# Patient Record
Sex: Female | Born: 1995 | Race: Black or African American | Hispanic: No | Marital: Single | Smoking: Never smoker
Health system: Southern US, Community
[De-identification: ages and names within clinical notes are randomized; demographics above are authoritative.]

---

## 2016-10-15 ENCOUNTER — Encounter (HOSPITAL_COMMUNITY): Payer: Self-pay | Admitting: Emergency Medicine

## 2016-10-15 ENCOUNTER — Emergency Department (HOSPITAL_COMMUNITY)
Admission: EM | Admit: 2016-10-15 | Discharge: 2016-10-16 | Disposition: A | Discharge status: Home / Self Care | Payer: BLUE CROSS/BLUE SHIELD | Attending: Emergency Medicine | Admitting: Emergency Medicine

## 2016-10-15 DIAGNOSIS — F17210 Nicotine dependence, cigarettes, uncomplicated: Secondary | ICD-10-CM | POA: Diagnosis not present

## 2016-10-15 DIAGNOSIS — J069 Acute upper respiratory infection, unspecified: Secondary | ICD-10-CM | POA: Insufficient documentation

## 2016-10-15 DIAGNOSIS — R0602 Shortness of breath: Secondary | ICD-10-CM | POA: Diagnosis present

## 2016-10-15 NOTE — ED Triage Notes (Signed)
Pt states she has felt short of breath off and on for the past week  Pt has no hx of asthma  Pt is in no acute distress

## 2016-10-16 ENCOUNTER — Emergency Department (HOSPITAL_COMMUNITY): Payer: BLUE CROSS/BLUE SHIELD

## 2016-10-16 MED ORDER — ALBUTEROL SULFATE HFA 108 (90 BASE) MCG/ACT IN AERS
2.0000 | INHALATION_SPRAY | RESPIRATORY_TRACT | Status: DC | PRN
  Filled 2016-10-16: qty 6.7

## 2016-10-16 NOTE — Discharge Instructions (Signed)
See your Physician for recheck.  Use inhaler 2 puffs every 4 hours.  °

## 2016-10-16 NOTE — ED Provider Notes (Signed)
  [facility]EMERGENCY DEPT Provider Note   CSN: [id number] Arrival date & time: 10/15/16  2312     History   Chief Complaint Chief Complaint  Patient presents with  . Shortness of Breath    HPI Karen Burch is a 21 y.o. female.  The history is provided by the patient.  Shortness of Breath  This is a new problem. The current episode started yesterday. Associated symptoms include sputum production. The treatment provided no relief. Associated medical issues do not include asthma.    History reviewed. No pertinent past medical history.  There are no active problems to display for this patient.   History reviewed. No pertinent surgical history.  OB History    No data available       Home Medications    Prior to Admission medications   Not on File    Family History Family History  Problem Relation Age of Onset  . Asthma Father   . Hypertension Other     Social History Social History  Substance Use Topics  . Smoking status: Current Every Day Smoker    Types: Cigarettes  . Smokeless tobacco: Never Used  . Alcohol use No     Allergies   Patient has no known allergies.   Review of Systems Review of Systems  Respiratory: Positive for sputum production and shortness of breath.   All other systems reviewed and are negative.    Physical Exam Updated Vital Signs BP 128/86 (BP Location: Left Arm)   Pulse 93   Temp 98.2 F (36.8 C) (Oral)   Resp 16   Ht  (1.6 m)   Wt 59 kg   LMP 10/08/2016 (Approximate)   SpO2 100%   BMI 23.03 kg/m   Physical Exam  Constitutional: She appears well-developed and well-nourished. No distress.  HENT:  Head: Normocephalic and atraumatic.  Eyes: Conjunctivae are normal.  Neck: Neck supple.  Cardiovascular: Normal rate and regular rhythm.   No murmur heard. Pulmonary/Chest: Effort normal and breath sounds normal. No respiratory distress.  Abdominal: Soft. There is no tenderness.  Musculoskeletal: She exhibits no  edema.  Neurological: She is alert.  Skin: Skin is warm and dry.  Psychiatric: She has a normal mood and affect.  Nursing note and vitals reviewed.    ED Treatments / Results  Labs (all labs ordered are listed, but only abnormal results are displayed) Labs Reviewed - No data to display  EKG  EKG Interpretation None       Radiology No results found.  Procedures Procedures (including critical care time)  Medications Ordered in ED Medications - No data to display   Initial Impression / Assessment and Plan / ED Course  I have reviewed the triage vital signs and the nursing notes.  Pertinent labs & imaging results that were available during my care of the patient were reviewed by me and considered in my medical decision making (see chart for details).       Final Clinical Impressions(s) / ED Diagnoses   Final diagnoses:  Viral upper respiratory tract infection    New Prescriptions New Prescriptions   No medications on file  Pt given albuterol inhaler.   Follow up with primary care for recheck in 3-4 days. An After Visit Summary was printed and given to the patient.   Lonia Skinner [place], PA-C 10/16/16 4098    Pricilla Loveless, MD 10/16/16 [phone]

## 2019-01-31 DIAGNOSIS — H5213 Myopia, bilateral: Secondary | ICD-10-CM | POA: Diagnosis not present

## 2019-01-31 DIAGNOSIS — H52223 Regular astigmatism, bilateral: Secondary | ICD-10-CM | POA: Diagnosis not present

## 2020-07-14 ENCOUNTER — Other Ambulatory Visit: Payer: Self-pay

## 2020-07-14 ENCOUNTER — Ambulatory Visit
Admission: EM | Admit: 2020-07-14 | Discharge: 2020-07-14 | Disposition: A | Discharge status: Home / Self Care | Payer: Federal, State, Local not specified - PPO | Attending: Internal Medicine | Admitting: Internal Medicine

## 2020-07-14 ENCOUNTER — Encounter: Payer: Self-pay | Admitting: Emergency Medicine

## 2020-07-14 DIAGNOSIS — R112 Nausea with vomiting, unspecified: Secondary | ICD-10-CM | POA: Diagnosis not present

## 2020-07-14 DIAGNOSIS — N3001 Acute cystitis with hematuria: Secondary | ICD-10-CM | POA: Insufficient documentation

## 2020-07-14 LAB — POCT URINALYSIS DIP (MANUAL ENTRY)
Bilirubin, UA: NEGATIVE
Glucose, UA: NEGATIVE mg/dL
Ketones, POC UA: NEGATIVE mg/dL
Nitrite, UA: NEGATIVE
Protein Ur, POC: NEGATIVE mg/dL
Spec Grav, UA: 1.015 (ref 1.010–1.025)
Urobilinogen, UA: 0.2 E.U./dL
pH, UA: 7 (ref 5.0–8.0)

## 2020-07-14 LAB — POCT URINE PREGNANCY: Preg Test, Ur: NEGATIVE

## 2020-07-14 MED ORDER — NITROFURANTOIN MONOHYD MACRO 100 MG PO CAPS: 100.0000 mg | ORAL_CAPSULE | Freq: Two times a day (BID) | ORAL | 0 refills | Status: AC

## 2020-07-14 NOTE — ED Triage Notes (Signed)
Pt sts nausea x 4 days with generalized weakness; pt sts some chills and had fever 2 days ago; pt sts "thinks she could be pregnant even though home test are negative" pt sts LMP was 12/24

## 2020-07-14 NOTE — ED Provider Notes (Signed)
[facility]    CSN: [id number] Arrival date & time: 07/14/20  1507      History   Chief Complaint Chief Complaint  Patient presents with  . Nausea  . Weakness    HPI Karen Burch is a 25 y.o. female with no past medical history comes to urgent care with complaints of early morning vomiting, fatigue over the past 4 days.   Patient's last menstrual period was June 26, 2020.  She denies any fever or chills.  No body aches.  No dizziness, no flank pain.  Patient has extreme fatigue over the course of the same.  No dizziness. HPI  History reviewed. No pertinent past medical history.  There are no problems to display for this patient.   History reviewed. No pertinent surgical history.  OB History   No obstetric history on file.      Home Medications    Prior to Admission medications   Not on File    Family History Family History  Problem Relation Age of Onset  . Asthma Father   . Hypertension Other     Social History Social History   Tobacco Use  . Smoking status: Current Every Day Smoker    Types: Cigarettes  . Smokeless tobacco: Never Used  Substance Use Topics  . Alcohol use: No  . Drug use: Yes    Types: Marijuana     Allergies   Patient has no known allergies.   Review of Systems Review of Systems  Constitutional: Negative.   Endocrine: Negative.   Genitourinary: Negative.   Musculoskeletal: Negative.   Neurological: Negative.      Physical Exam Triage Vital Signs ED Triage Vitals [07/14/20 1652]  Enc Vitals Group     BP 128/84     Pulse Rate 86     Resp 18     Temp 97.9 F (36.6 C)     Temp Source Oral     SpO2 99 %     Weight      Height      Head Circumference      Peak Flow      Pain Score 4     Pain Loc      Pain Edu?      Excl. in GC?    No data found.  Updated Vital Signs BP 128/84   Pulse 86   Temp 97.9 F (36.6 C) (Oral)   Resp 18   LMP 06/26/2020   SpO2 99%   Visual Acuity Right Eye  Distance:   Left Eye Distance:   Bilateral Distance:    Right Eye Near:   Left Eye Near:    Bilateral Near:     Physical Exam Vitals and nursing note reviewed.  Constitutional:      General: She is not in acute distress.    Appearance: She is not ill-appearing.  Cardiovascular:     Rate and Rhythm: Normal rate and regular rhythm.     Pulses: Normal pulses.     Heart sounds: Normal heart sounds.  Pulmonary:     Effort: Pulmonary effort is normal.     Breath sounds: Normal breath sounds.  Abdominal:     General: Bowel sounds are normal.     Palpations: Abdomen is soft.  Neurological:     Mental Status: She is alert.      UC Treatments / Results  Labs (all labs ordered are listed, but only abnormal results are displayed) Labs Reviewed  POCT  URINALYSIS DIP (MANUAL ENTRY) - Abnormal; Notable for the following components:      Result Value   Clarity, UA cloudy (*)    Blood, UA trace-intact (*)    Leukocytes, UA Trace (*)    All other components within normal limits  URINE CULTURE  CBC WITH DIFFERENTIAL/PLATELET  TSH  VITAMIN D 25 HYDROXY (VIT D DEFICIENCY, FRACTURES)  POCT URINE PREGNANCY    EKG   Radiology No results found.  Procedures Procedures (including critical care time)  Medications Ordered in UC Medications - No data to display  Initial Impression / Assessment and Plan / UC Course  I have reviewed the triage vital signs and the nursing notes.  Pertinent labs & imaging results that were available during my care of the patient were reviewed by me and considered in my medical decision making (see chart for details).     1.  Fatigue with non-intractable nausea or vomiting: Point-of-care urinalysis is significant for leukocyte Estrace and nitrites Urine cultures have been sent Patient denies any dysuria urgency or frequency We will wait for the urine cultures before starting antibiotics if cultures are positive CBC, BMP, vitamin D, TSH. Will call  patient with labs if abnormal. Final Clinical Impressions(s) / UC Diagnoses   Final diagnoses:  Acute cystitis with hematuria  Non-intractable vomiting with nausea, unspecified vomiting type     Discharge Instructions     Take medications as prescribed If symptoms worsen please return to urgent care to be reevaluated Increase oral fluid intake We will call you with recommendations of your lab results abnormal.   ED Prescriptions    None     PDMP not reviewed this encounter.   Merrilee Jansky, MD 07/14/20 [phone]

## 2020-07-14 NOTE — Discharge Instructions (Addendum)
Take medications as prescribed Hold off filling the antibiotics.  If your urine cultures are significant, we will tell you when to pick up the antibiotics. If symptoms worsen please return to urgent care to be reevaluated Increase oral fluid intake We will call you with recommendations of your lab results abnormal.

## 2020-07-15 LAB — CBC WITH DIFFERENTIAL/PLATELET
Basophils Absolute: 0 10*3/uL (ref 0.0–0.2)
Basos: 1 %
EOS (ABSOLUTE): 0 10*3/uL (ref 0.0–0.4)
Eos: 0 %
Hematocrit: 42.5 % (ref 34.0–46.6)
Hemoglobin: 13.9 g/dL (ref 11.1–15.9)
Immature Grans (Abs): 0 10*3/uL (ref 0.0–0.1)
Immature Granulocytes: 0 %
Lymphocytes Absolute: 1.5 10*3/uL (ref 0.7–3.1)
Lymphs: 25 %
MCH: 26 pg — ABNORMAL LOW (ref 26.6–33.0)
MCHC: 32.7 g/dL (ref 31.5–35.7)
MCV: 79 fL (ref 79–97)
Monocytes Absolute: 0.5 10*3/uL (ref 0.1–0.9)
Monocytes: 7 %
Neutrophils Absolute: 4.1 10*3/uL (ref 1.4–7.0)
Neutrophils: 67 %
Platelets: 477 10*3/uL — ABNORMAL HIGH (ref 150–450)
RBC: 5.35 x10E6/uL — ABNORMAL HIGH (ref 3.77–5.28)
RDW: 13.9 % (ref 11.7–15.4)
WBC: 6.1 10*3/uL (ref 3.4–10.8)

## 2020-07-15 LAB — TSH: TSH: 1.31 u[IU]/mL (ref 0.450–4.500)

## 2020-07-15 LAB — VITAMIN D 25 HYDROXY (VIT D DEFICIENCY, FRACTURES): Vit D, 25-Hydroxy: 8.2 ng/mL — ABNORMAL LOW (ref 30.0–100.0)

## 2020-07-16 LAB — URINE CULTURE
Culture: <10000 — AB
Special Requests: NORMAL

## 2020-08-03 ENCOUNTER — Other Ambulatory Visit: Payer: Self-pay

## 2020-08-03 ENCOUNTER — Emergency Department (HOSPITAL_COMMUNITY)
Admission: EM | Admit: 2020-08-03 | Discharge: 2020-08-03 | Disposition: A | Discharge status: Home / Self Care | Payer: Federal, State, Local not specified - PPO | Attending: Emergency Medicine | Admitting: Emergency Medicine

## 2020-08-03 ENCOUNTER — Encounter: Payer: Self-pay | Admitting: Physician Assistant

## 2020-08-03 DIAGNOSIS — Z20822 Contact with and (suspected) exposure to covid-19: Secondary | ICD-10-CM | POA: Insufficient documentation

## 2020-08-03 DIAGNOSIS — F17210 Nicotine dependence, cigarettes, uncomplicated: Secondary | ICD-10-CM | POA: Diagnosis not present

## 2020-08-03 DIAGNOSIS — R112 Nausea with vomiting, unspecified: Secondary | ICD-10-CM | POA: Diagnosis not present

## 2020-08-03 LAB — URINALYSIS, ROUTINE W REFLEX MICROSCOPIC
Bilirubin Urine: NEGATIVE
Glucose, UA: NEGATIVE mg/dL
Hgb urine dipstick: NEGATIVE
Ketones, ur: NEGATIVE mg/dL
Leukocytes,Ua: NEGATIVE
Nitrite: NEGATIVE
Protein, ur: NEGATIVE mg/dL
Specific Gravity, Urine: 1.024 (ref 1.005–1.030)
pH: 5 (ref 5.0–8.0)

## 2020-08-03 LAB — COMPREHENSIVE METABOLIC PANEL
ALT: 16 U/L (ref 0–44)
AST: 19 U/L (ref 15–41)
Albumin: 4 g/dL (ref 3.5–5.0)
Alkaline Phosphatase: 85 U/L (ref 38–126)
Anion gap: 11 (ref 5–15)
BUN: 12 mg/dL (ref 6–20)
CO2: 25 mmol/L (ref 22–32)
Calcium: 9.2 mg/dL (ref 8.9–10.3)
Chloride: 102 mmol/L (ref 98–111)
Creatinine, Ser: 0.84 mg/dL (ref 0.44–1.00)
GFR, Estimated: >60 mL/min (ref >60)
Glucose, Bld: 93 mg/dL (ref 70–99)
Potassium: 3.9 mmol/L (ref 3.5–5.1)
Sodium: 138 mmol/L (ref 135–145)
Total Bilirubin: 0.6 mg/dL (ref 0.3–1.2)
Total Protein: 8.3 g/dL — ABNORMAL HIGH (ref 6.5–8.1)

## 2020-08-03 LAB — RAPID URINE DRUG SCREEN, HOSP PERFORMED
Amphetamines: NOT DETECTED
Barbiturates: NOT DETECTED
Benzodiazepines: NOT DETECTED
Cocaine: NOT DETECTED
Opiates: NOT DETECTED
Tetrahydrocannabinol: NOT DETECTED

## 2020-08-03 LAB — CBC
HCT: 43 % (ref 36.0–46.0)
Hemoglobin: 14.1 g/dL (ref 12.0–15.0)
MCH: 27.4 pg (ref 26.0–34.0)
MCHC: 32.8 g/dL (ref 30.0–36.0)
MCV: 83.5 fL (ref 80.0–100.0)
Platelets: 462 10*3/uL — ABNORMAL HIGH (ref 150–400)
RBC: 5.15 MIL/uL — ABNORMAL HIGH (ref 3.87–5.11)
RDW: 15.2 % (ref 11.5–15.5)
WBC: 5 10*3/uL (ref 4.0–10.5)
nRBC: 0 % (ref 0.0–0.2)

## 2020-08-03 LAB — I-STAT BETA HCG BLOOD, ED (MC, WL, AP ONLY): I-stat hCG, quantitative: <5 m[IU]/mL (ref <5)

## 2020-08-03 LAB — LIPASE, BLOOD: Lipase: 32 U/L (ref 11–51)

## 2020-08-03 LAB — POC SARS CORONAVIRUS 2 AG -  ED: SARS Coronavirus 2 Ag: NEGATIVE

## 2020-08-03 MED ORDER — ONDANSETRON HCL 4 MG/2ML IJ SOLN
4.0000 mg | Freq: Once | INTRAMUSCULAR | Status: AC
  Administered 2020-08-03: 4 mg via INTRAVENOUS
  Filled 2020-08-03: qty 2

## 2020-08-03 MED ORDER — SODIUM CHLORIDE 0.9 % IV BOLUS
1000.0000 mL | Freq: Once | INTRAVENOUS | Status: AC
  Administered 2020-08-03: 1000 mL via INTRAVENOUS

## 2020-08-03 MED ORDER — ONDANSETRON 4 MG PO TBDP: 4.0000 mg | ORAL_TABLET | Freq: Three times a day (TID) | ORAL | 0 refills | Status: DC | PRN

## 2020-08-03 NOTE — Discharge Instructions (Addendum)
Please follow up with [facility] regarding your persistent symptoms Pick up medication and take as prescribed Increase your water intake at home to stay hydrated Return to the ED for any worsening symptoms

## 2020-08-03 NOTE — ED Triage Notes (Signed)
Pt reports n/v x 1 month, endorses vomiting every morning. Multiple negative pregnancy tests at home. Was dx with UTI at [facility] on 1/11 and has completed course of abx.

## 2020-08-03 NOTE — ED Provider Notes (Signed)
[facility] EMERGENCY DEPARTMENT Provider Note   CSN: [id number] Arrival date & time: 08/03/20  5329     History Chief Complaint  Patient presents with  . Nausea  . Emesis    Karen Burch is a 25 y.o. female who presents to the ED today with complaint of persistent nausea and NBNB emesis x 6 weeks. Pt reports that every morning she will wake up and feel immediately nauseated and then go to the bathroom and vomit. She reports that throughout the day at times she will feel lightheaded as well and like she needs to sit down. She has taken multiple pregnancy tests at home which have been negative. LNMP 1/20. Pt went to UC on 1/11 and had a U/A done at that time with trace leuks and was diagnosed with a UTI despite no urinary symptoms. Urine pregnancy, TSH, and CBC unremarkable at that time. Pt did have a VitD level drawn which was low as well - states she was not told about this. She was prescribed nitrofurantoin and states she had no improvement in her symptoms. Pt denies any abdominal pain associated. She does report she is now having mild looser stools however denies severe watery diarrhea. Denies fevers, chills, room spinning dizziness, headache, unilateral weakness or numbness, confusion, urinary symptoms, pelvic pain, vaginal discharge, or any other associated symptoms. No previous abdominal surgeries. Pt reports she has smoked marijuana in the past however denies any recently.   The history is provided by the patient and medical records.       No past medical history on file.  There are no problems to display for this patient.   No past surgical history on file.   OB History   No obstetric history on file.     Family History  Problem Relation Age of Onset  . Asthma Father   . Hypertension Other     Social History   Tobacco Use  . Smoking status: Current Every Day Smoker    Types: Cigarettes  . Smokeless tobacco: Never Used  Substance Use Topics  .  Alcohol use: No  . Drug use: Yes    Types: Marijuana    Home Medications Prior to Admission medications   Medication Sig Start Date End Date Taking? Authorizing Provider  ondansetron (ZOFRAN ODT) 4 MG disintegrating tablet Take 1 tablet (4 mg total) by mouth every 8 (eight) hours as needed for nausea or vomiting. 08/03/20  Yes Tanda Rockers, PA-C    Allergies    Patient has no known allergies.  Review of Systems   Review of Systems  Constitutional: Negative for chills and fever.  Eyes: Negative for visual disturbance.  Respiratory: Negative for shortness of breath.   Cardiovascular: Negative for chest pain.  Gastrointestinal: Positive for diarrhea, nausea and vomiting. Negative for abdominal pain and constipation.  Genitourinary: Negative for dysuria, frequency, menstrual problem, vaginal discharge and vaginal pain.  Neurological: Positive for light-headedness. Negative for dizziness, syncope, weakness, numbness and headaches.  All other systems reviewed and are negative.   Physical Exam Updated Vital Signs BP 128/87   Pulse 82   Temp 99.7 F (37.6 C) (Oral)   Resp 16   Ht 5\' 3"  (1.6 m)   LMP 07/23/2020 (Exact Date)   SpO2 100%   BMI 23.03 kg/m   Physical Exam Vitals and nursing note reviewed.  Constitutional:      Appearance: She is not ill-appearing or diaphoretic.  HENT:     Head: Normocephalic and atraumatic.  Mouth/Throat:     Mouth: Mucous membranes are dry.  Eyes:     Extraocular Movements: Extraocular movements intact.     Conjunctiva/sclera: Conjunctivae normal.     Pupils: Pupils are equal, round, and reactive to light.  Cardiovascular:     Rate and Rhythm: Normal rate and regular rhythm.     Pulses: Normal pulses.  Pulmonary:     Effort: Pulmonary effort is normal.     Breath sounds: Normal breath sounds. No wheezing, rhonchi or rales.  Abdominal:     Palpations: Abdomen is soft.     Tenderness: There is no abdominal tenderness. There is no  right CVA tenderness, left CVA tenderness, guarding or rebound.  Musculoskeletal:     Cervical back: Neck supple.  Skin:    General: Skin is warm and dry.  Neurological:     Mental Status: She is alert.     ED Results / Procedures / Treatments   Labs (all labs ordered are listed, but only abnormal results are displayed) Labs Reviewed  COMPREHENSIVE METABOLIC PANEL - Abnormal; Notable for the following components:      Result Value   Total Protein 8.3 (*)    All other components within normal limits  CBC - Abnormal; Notable for the following components:   RBC 5.15 (*)    Platelets 462 (*)    All other components within normal limits  URINALYSIS, ROUTINE W REFLEX MICROSCOPIC - Abnormal; Notable for the following components:   APPearance HAZY (*)    All other components within normal limits  LIPASE, BLOOD  RAPID URINE DRUG SCREEN, HOSP PERFORMED  I-STAT BETA HCG BLOOD, ED ([facility], [facility], [facility] ONLY)  POC SARS CORONAVIRUS 2 AG -  ED    EKG None  Radiology No results found.  Procedures Procedures   Medications Ordered in ED Medications  sodium chloride 0.9 % bolus 1,000 mL (1,000 mLs Intravenous New Bag/Given 08/03/20 1011)  ondansetron (ZOFRAN) injection 4 mg (4 mg Intravenous Given 08/03/20 1007)    ED Course  I have reviewed the triage vital signs and the nursing notes.  Pertinent labs & imaging results that were available during my care of the patient were reviewed by me and considered in my medical decision making (see chart for details).    MDM Rules/Calculators/A&P                          25 year old female presents to the ED today complaining of persistent nausea and vomiting most pacifically in the mornings for the past 6 weeks.  Was seen at urgent care and diagnosed with a UTI 2 weeks ago despite not having any urinary symptoms, no improvement with antibiotics.  On arrival to the ED temperature slightly elevated 99.7.  She is nontachycardic, nontachypneic.  She  appears to be in no acute distress.  Blood pressure normotensive at 128/87.  On exam she has no abdominal tenderness palpation.  No CVA tenderness.  She does have some dry mucous membranes today.  Normal menstrual period 1/20.  She had lab work done while in the waiting room which was unremarkable including a CBC, CMP, lipase, UA, beta hCG as well as a point-of-care Covid test.  Given dry mucous membranes will provide fluids and Zofran at this time.  We will also add on a UDS today, question CHS causing symptoms.  Given no abdominal tenderness palpation and no complaint of abdominal pain I do not feel a CT scan is  warranted at this time.  Patient may benefit from GI referral in the outpatient setting.    Orthostatics positive from 128/83 > 106/73. Fluids provided UDS negative  On reevaluation pt resting comfortably. She has been able to eat crackers and drink without emesis. Will discharge home at this time with zofran and GI referral. Pt is in agreement with plan. Stable for discharge.   This note was prepared using Dragon voice recognition software and may include unintentional dictation errors due to the inherent limitations of voice recognition software.  Final Clinical Impression(s) / ED Diagnoses Final diagnoses:  Non-intractable vomiting with nausea, unspecified vomiting type    Rx / DC Orders ED Discharge Orders         Ordered    ondansetron (ZOFRAN ODT) 4 MG disintegrating tablet  Every 8 hours PRN        08/03/20 1214           Discharge Instructions     Please follow up with [facility] regarding your persistent symptoms Pick up medication and take as prescribed Increase your water intake at home to stay hydrated Return to the ED for any worsening symptoms       Tanda Rockers, PA-C 08/03/20 1215    Virgina Norfolk, DO 08/03/20 1224

## 2020-08-09 ENCOUNTER — Encounter (HOSPITAL_COMMUNITY): Payer: Self-pay | Admitting: Emergency Medicine

## 2020-08-09 ENCOUNTER — Emergency Department (HOSPITAL_COMMUNITY)
Admission: EM | Admit: 2020-08-09 | Discharge: 2020-08-09 | Disposition: A | Discharge status: Home / Self Care | Payer: Federal, State, Local not specified - PPO | Attending: Emergency Medicine | Admitting: Emergency Medicine

## 2020-08-09 ENCOUNTER — Other Ambulatory Visit: Payer: Self-pay

## 2020-08-09 DIAGNOSIS — Z5321 Procedure and treatment not carried out due to patient leaving prior to being seen by health care provider: Secondary | ICD-10-CM | POA: Diagnosis not present

## 2020-08-09 DIAGNOSIS — R1031 Right lower quadrant pain: Secondary | ICD-10-CM | POA: Insufficient documentation

## 2020-08-09 DIAGNOSIS — R1110 Vomiting, unspecified: Secondary | ICD-10-CM | POA: Insufficient documentation

## 2020-08-09 LAB — URINALYSIS, ROUTINE W REFLEX MICROSCOPIC
Bilirubin Urine: NEGATIVE
Glucose, UA: NEGATIVE mg/dL
Hgb urine dipstick: NEGATIVE
Ketones, ur: NEGATIVE mg/dL
Leukocytes,Ua: NEGATIVE
Nitrite: NEGATIVE
Protein, ur: NEGATIVE mg/dL
Specific Gravity, Urine: 1.028 (ref 1.005–1.030)
pH: 6 (ref 5.0–8.0)

## 2020-08-09 LAB — COMPREHENSIVE METABOLIC PANEL
ALT: 15 U/L (ref 0–44)
AST: 18 U/L (ref 15–41)
Albumin: 3.8 g/dL (ref 3.5–5.0)
Alkaline Phosphatase: 74 U/L (ref 38–126)
Anion gap: 9 (ref 5–15)
BUN: 12 mg/dL (ref 6–20)
CO2: 25 mmol/L (ref 22–32)
Calcium: 9.3 mg/dL (ref 8.9–10.3)
Chloride: 105 mmol/L (ref 98–111)
Creatinine, Ser: 0.87 mg/dL (ref 0.44–1.00)
GFR, Estimated: >60 mL/min (ref >60)
Glucose, Bld: 93 mg/dL (ref 70–99)
Potassium: 4.4 mmol/L (ref 3.5–5.1)
Sodium: 139 mmol/L (ref 135–145)
Total Bilirubin: 0.4 mg/dL (ref 0.3–1.2)
Total Protein: 7.9 g/dL (ref 6.5–8.1)

## 2020-08-09 LAB — CBC
HCT: 43 % (ref 36.0–46.0)
Hemoglobin: 13.2 g/dL (ref 12.0–15.0)
MCH: 25.6 pg — ABNORMAL LOW (ref 26.0–34.0)
MCHC: 30.7 g/dL (ref 30.0–36.0)
MCV: 83.3 fL (ref 80.0–100.0)
Platelets: 412 10*3/uL — ABNORMAL HIGH (ref 150–400)
RBC: 5.16 MIL/uL — ABNORMAL HIGH (ref 3.87–5.11)
RDW: 15 % (ref 11.5–15.5)
WBC: 4.2 10*3/uL (ref 4.0–10.5)
nRBC: 0 % (ref 0.0–0.2)

## 2020-08-09 LAB — LIPASE, BLOOD: Lipase: 29 U/L (ref 11–51)

## 2020-08-09 LAB — I-STAT BETA HCG BLOOD, ED (MC, WL, AP ONLY): I-stat hCG, quantitative: <5 m[IU]/mL (ref <5)

## 2020-08-09 NOTE — ED Notes (Signed)
Patient states she has been here 9 hours and is unable to stay any longer

## 2020-08-09 NOTE — ED Triage Notes (Signed)
C/o RLQ pressure since Friday. Denies urinary complaints.  States she was seen in ED on 1/31 due to vomiting every morning x 1 month.  States that continues and she is scheduled to see gastroenterologist on 2/14 but pain is new since Friday.

## 2020-08-17 ENCOUNTER — Encounter: Payer: Self-pay | Admitting: Physician Assistant

## 2020-08-17 ENCOUNTER — Ambulatory Visit (INDEPENDENT_AMBULATORY_CARE_PROVIDER_SITE_OTHER): Payer: Federal, State, Local not specified - PPO | Admitting: Physician Assistant

## 2020-08-17 VITALS — BP 118/74 | HR 80 | Ht 63.0 in | Wt 182.0 lb

## 2020-08-17 DIAGNOSIS — R112 Nausea with vomiting, unspecified: Secondary | ICD-10-CM | POA: Diagnosis not present

## 2020-08-17 DIAGNOSIS — R1030 Lower abdominal pain, unspecified: Secondary | ICD-10-CM | POA: Diagnosis not present

## 2020-08-17 MED ORDER — ONDANSETRON 4 MG PO TBDP: 4.0000 mg | ORAL_TABLET | Freq: Four times a day (QID) | ORAL | 1 refills | Status: DC | PRN

## 2020-08-17 NOTE — Patient Instructions (Addendum)
If you are age 24 or older, your body mass index should be between 23-30. Your Body mass index is 32.24 kg/m. If this is out of the aforementioned range listed, please consider follow up with your Primary Care Provider.  If you are age 26 or younger, your body mass index should be between 19-25. Your Body mass index is 32.24 kg/m. If this is out of the aformentioned range listed, please consider follow up with your Primary Care Provider.   You have been scheduled for a CT scan of the abdomen and pelvis at [facility], 1st floor Radiology. You are scheduled on 08/28/2020  at 8:30. You should arrive 15 minutes prior to your appointment time for registration.  Please pick up 2 bottles of contrast from [facility] at least 3 days prior to your scan. The solution may taste better if refrigerated, but do NOT add ice or any other liquid to this solution. Shake well before drinking.   Please follow the written instructions below on the day of your exam:   1) Do not eat anything after 4:30 am (4 hours prior to your test)   2) Drink 1 bottle of contrast @ 6:30 am (2 hours prior to your exam)  Remember to shake well before drinking and do NOT pour over ice.     Drink 1 bottle of contrast @ 7:30 am (1 hour prior to your exam)   You may take any medications as prescribed with a small amount of water, if necessary. If you take any of the following medications: METFORMIN, GLUCOPHAGE, GLUCOVANCE, AVANDAMET, RIOMET, FORTAMET, [place] MET, JANUMET, GLUMETZA or METAGLIP, you MAY be asked to HOLD this medication 48 hours AFTER the exam.   The purpose of you drinking the oral contrast is to aid in the visualization of your intestinal tract. The contrast solution may cause some diarrhea. Depending on your individual set of symptoms, you may also receive an intravenous injection of x-ray contrast/dye. Plan on being at [facility] for 45 minutes or longer, depending on the type of exam you are having performed.    If you have any questions regarding your exam or if you need to reschedule, you may call [facility] at [phone] between the hours of 8:00 am and 5:00 pm, Monday-Friday.   We have sent in refill of your Ondansetron  Follow up pending the results of your CT or as needed.  Thank you for entrusting me with your care and choosing [facility].  Amy Esterwood, PA-C

## 2020-08-17 NOTE — Progress Notes (Signed)
Subjective:    Patient ID: Karen Burch, female    DOB: [birth year], 25 y.o.   MRN: [id number]  HPI Karen Burch is a 25 year old African-American female, new to GI today referred after recent emergency room visit at [facility].  She has not had any prior GI evaluation.  She was seen in the ER on 07/14/2020 with nausea and vomiting and felt to have cystitis and was treated with a course of nitrofurantoin.  She returned 2 weeks later with persistent complaints of nausea and vomiting.  She has not had any abdominal imaging.  She has had baseline labs with CBC and c-Met unremarkable, beta hCG on 2 occasions negative and COVID-19 negative on 08/03/2020.  She has been given a prescription for Zofran and says that is definitely helping. She says she had onset of symptoms in the beginning of January with ongoing queasiness and intermittent episodes of vomiting.  She was not having any abdominal pain at onset of symptoms.  Prior to that she had been experiencing some intermittent low-grade fevers and lightheadedness which then resolved.  She is not had any changes in bowel habits.  Over the past 4 days symptoms have resolved and she has not had any nausea vomiting or queasiness.  She says she is eating fine at this point and feels better she has not had any heartburn or indigestion.  No new medications supplements and no significant stress etc.  No known infectious exposures. She also had ER visit because she was feeling a very heavy pressure bilaterally in her lower quadrants.  She says she waited for about 8 hours in the ER and was not seen and finally left. She has been having some sensation of this mild pressure heaviness bilaterally in the lower abdomen continuing. Family history negative for GI disease as far she is aware. She has no complaints of any urinary symptoms today.  Bowel movements fairly regular no melena or hematochezia.  Review of Systems Pertinent positive and negative review of systems were noted  in the above HPI section.  All other review of systems was otherwise negative.  Outpatient Encounter Medications as of 08/17/2020  Medication Sig  . acetaminophen (TYLENOL) 325 MG tablet Take 650 mg by mouth every 6 (six) hours as needed.  . ondansetron (ZOFRAN ODT) 4 MG disintegrating tablet Take 1 tablet (4 mg total) by mouth every 6 (six) hours as needed for nausea or vomiting.  . [DISCONTINUED] ondansetron (ZOFRAN ODT) 4 MG disintegrating tablet Take 1 tablet (4 mg total) by mouth every 8 (eight) hours as needed for nausea or vomiting. (Patient not taking: Reported on 08/17/2020)   No facility-administered encounter medications on file as of 08/17/2020.   No Known Allergies There are no problems to display for this patient.  Social History   Socioeconomic History  . Marital status: Single    Spouse name: Not on file  . Number of children: Not on file  . Years of education: Not on file  . Highest education level: Not on file  Occupational History  . Not on file  Tobacco Use  . Smoking status: Never Smoker  . Smokeless tobacco: Never Used  Vaping Use  . Vaping Use: Never used  Substance and Sexual Activity  . Alcohol use: Yes  . Drug use: Not Currently    Types: Marijuana  . Sexual activity: Not on file  Other Topics Concern  . Not on file  Social History Narrative  . Not on file   Social Determinants  of Health   Financial Resource Strain: Not on file  Food Insecurity: Not on file  Transportation Needs: Not on file  Physical Activity: Not on file  Stress: Not on file  Social Connections: Not on file  Intimate Partner Violence: Not on file    Karen Burch's family history includes Asthma in her father; Cancer in her maternal grandfather; Depression in her mother; Heart attack in her paternal grandmother; Hypertension in an other family member.      Objective:    Vitals:   08/17/20 1527  BP: 118/74  Pulse: 80  SpO2: 99%    Physical Exam Well-developed  well-nourished young African-American female in no acute distress.  Height, Weight, BMI 32.2  HEENT; nontraumatic normocephalic, EOMI, PE R LA, sclera anicteric. Oropharynx; not examined today Neck; supple, no JVD Cardiovascular; regular rate and rhythm with S1-S2, no murmur rub or gallop Pulmonary; Clear bilaterally Abdomen; soft,  nondistended, she is tender bilaterally in the lower quadrants, no guarding or rebound no palpable mass or hepatosplenomegaly, bowel sounds are active Rectal; not done today Skin; benign exam, no jaundice rash or appreciable lesions Extremities; no clubbing cyanosis or edema skin warm and dry Neuro/Psych; alert and oriented x4, grossly nonfocal mood and affect appropriate       Assessment & Plan:   #10 25 year old African-American female with 1 month history of nausea and vomiting, resolved over the past 4 days Etiology is not clear, recent labs unrevealing. Patient did have cystitis with initial ER visit without any urinary symptoms but no improvement after treatment with antibiotics. Rule out viral syndrome  #2 bilateral lower quadrant abdominal tenderness and sensation of pressure over the past 1 month.  Etiology is not clear.  Rule out intra-abdominal inflammatory process, rule out IBD, rule out GYN etiology. Beta hCG  Negative  08/10/2019  Plan; refill Zofran 4 mg ODT for as needed use, fortunately she is not required over the past 4 to 5 days. We will schedule for CT scan of the abdomen and pelvis with contrast. Further recommendations pending findings at CT. Patient will be established with Dr. Loletha Carrow.  Karen Burch S Karen Storr PA-C 08/17/2020   Cc: No ref. provider found

## 2020-08-18 DIAGNOSIS — Z03818 Encounter for observation for suspected exposure to other biological agents ruled out: Secondary | ICD-10-CM | POA: Diagnosis not present

## 2020-08-18 DIAGNOSIS — Z20822 Contact with and (suspected) exposure to covid-19: Secondary | ICD-10-CM | POA: Diagnosis not present

## 2020-08-18 NOTE — Progress Notes (Signed)
____________________________________________________________  Attending physician addendum:  Thank you for sending this case to me. I have reviewed the entire note and agree with the plan.   Peyton Rossner Danis, MD  ____________________________________________________________  

## 2020-08-28 ENCOUNTER — Ambulatory Visit (HOSPITAL_COMMUNITY): Payer: Federal, State, Local not specified - PPO

## 2020-09-03 ENCOUNTER — Ambulatory Visit (HOSPITAL_COMMUNITY): Payer: Federal, State, Local not specified - PPO

## 2020-09-09 DIAGNOSIS — H04123 Dry eye syndrome of bilateral lacrimal glands: Secondary | ICD-10-CM | POA: Diagnosis not present

## 2020-09-10 ENCOUNTER — Other Ambulatory Visit: Payer: Self-pay

## 2020-09-10 ENCOUNTER — Ambulatory Visit (HOSPITAL_COMMUNITY)
Admission: RE | Admit: 2020-09-10 | Discharge: 2020-09-10 | Disposition: A | Discharge status: Home / Self Care | Payer: Federal, State, Local not specified - PPO | Source: Ambulatory Visit | Attending: Physician Assistant | Admitting: Physician Assistant

## 2020-09-10 ENCOUNTER — Encounter (HOSPITAL_COMMUNITY): Payer: Self-pay

## 2020-09-10 DIAGNOSIS — R112 Nausea with vomiting, unspecified: Secondary | ICD-10-CM | POA: Diagnosis not present

## 2020-09-10 DIAGNOSIS — R1110 Vomiting, unspecified: Secondary | ICD-10-CM | POA: Diagnosis not present

## 2020-09-10 DIAGNOSIS — R1030 Lower abdominal pain, unspecified: Secondary | ICD-10-CM | POA: Insufficient documentation

## 2020-09-10 MED ORDER — IOHEXOL 300 MG/ML  SOLN
100.0000 mL | Freq: Once | INTRAMUSCULAR | Status: AC | PRN
  Administered 2020-09-10: 100 mL via INTRAVENOUS

## 2020-10-02 ENCOUNTER — Ambulatory Visit: Payer: Federal, State, Local not specified - PPO | Attending: Internal Medicine | Admitting: Internal Medicine

## 2020-10-02 ENCOUNTER — Other Ambulatory Visit: Payer: Self-pay

## 2020-10-02 ENCOUNTER — Encounter: Payer: Self-pay | Admitting: Internal Medicine

## 2020-10-02 DIAGNOSIS — Z7689 Persons encountering health services in other specified circumstances: Secondary | ICD-10-CM

## 2020-10-02 DIAGNOSIS — R1030 Lower abdominal pain, unspecified: Secondary | ICD-10-CM | POA: Diagnosis not present

## 2020-10-02 NOTE — Progress Notes (Addendum)
Virtual Visit via Telephone Note  I connected with Karen Burch on 4/1//22 at  8:42 a.m by telephone and verified that I am speaking with the correct person using two identifiers.  Location: Patient: home Provider: office  Participants: Myself Patient CMA: Ms. Reginia Forts interpreter:   I discussed the limitations, risks, security and privacy concerns of performing an evaluation and management service by telephone and the availability of in person appointments. I also discussed with the patient that there may be a patient responsible charge related to this service. The patient expressed understanding and agreed to proceed.   History of Present Illness: Pt  Wanting to est care with [place]. No PCP in a while. No significant PMH. Not on any meds. No past surgeries Social history reviewed and updated in the system.  Patient's main concern today is wanting to know what was causing lower abdominal pain the early part of this year.  Reports that she was having some feeling of pressure in the lower abdomen the early part of the year.  She would wake up in the mornings with nausea and vomiting for 1-1/2 months.  Seen in the ER for the same the end of January.  Reports work-up was unrevealing.  Referred to gastroenterology.  She saw the gastroenterologist PA on the 14th of last month and CAT scan of the abdomen/pelvis was ordered.  Study was unremarkable.  Patient states that symptoms resolved at the end of February.  Last pap in 2017.     Observations/Objective: Results for orders placed or performed during the hospital encounter of 08/09/20  Lipase, blood  Result Value Ref Range   Lipase 29 11 - 51 U/L  Comprehensive metabolic panel  Result Value Ref Range   Sodium 139 135 - 145 mmol/L   Potassium 4.4 3.5 - 5.1 mmol/L   Chloride 105 98 - 111 mmol/L   CO2 25 22 - 32 mmol/L   Glucose, Bld 93 70 - 99 mg/dL   BUN 12 6 - 20 mg/dL   Creatinine, Ser 6.28 0.44 - 1.00 mg/dL   Calcium 9.3  8.9 - 36.6 mg/dL   Total Protein 7.9 6.5 - 8.1 g/dL   Albumin 3.8 3.5 - 5.0 g/dL   AST 18 15 - 41 U/L   ALT 15 0 - 44 U/L   Alkaline Phosphatase 74 38 - 126 U/L   Total Bilirubin 0.4 0.3 - 1.2 mg/dL   GFR, Estimated >29 >47 mL/min   Anion gap 9 5 - 15  CBC  Result Value Ref Range   WBC 4.2 4.0 - 10.5 K/uL   RBC 5.16 (H) 3.87 - 5.11 MIL/uL   Hemoglobin 13.2 12.0 - 15.0 g/dL   HCT 65.4 65.0 - 35.4 %   MCV 83.3 80.0 - 100.0 fL   MCH 25.6 (L) 26.0 - 34.0 pg   MCHC 30.7 30.0 - 36.0 g/dL   RDW 65.6 81.2 - 75.1 %   Platelets 412 (H) 150 - 400 K/uL   nRBC 0.0 0.0 - 0.2 %  Urinalysis, Routine w reflex microscopic Urine, Clean Catch  Result Value Ref Range   Color, Urine YELLOW YELLOW   APPearance HAZY (A) CLEAR   Specific Gravity, Urine 1.028 1.005 - 1.030   pH 6.0 5.0 - 8.0   Glucose, UA NEGATIVE NEGATIVE mg/dL   Hgb urine dipstick NEGATIVE NEGATIVE   Bilirubin Urine NEGATIVE NEGATIVE   Ketones, ur NEGATIVE NEGATIVE mg/dL   Protein, ur NEGATIVE NEGATIVE mg/dL   Nitrite NEGATIVE NEGATIVE  Leukocytes,Ua NEGATIVE NEGATIVE  I-Stat beta hCG blood, ED  Result Value Ref Range   I-stat hCG, quantitative <5.0 <5 mIU/mL   Comment 3             Assessment and Plan: 1. Establishing care with new doctor, encounter for  2. Abdominal pain, lower -Advised patient that since symptoms have resolved, I would recommend observing for now and follow-up if she has recurrence of symptoms.  She has not had a physical or Pap smear in several years.  She is agreeable to having one scheduled with [place].   Follow Up Instructions: 4-7 wks for annual exam including pap.  Message sent to schedulers.   I discussed the assessment and treatment plan with the patient. The patient was provided an opportunity to ask questions and all were answered. The patient agreed with the plan and demonstrated an understanding of the instructions.   The patient was advised to call back or seek an in-person evaluation if the  symptoms worsen or if the condition fails to improve as anticipated.  I  Spent 10 minutes on this telephone encounter  Jonah Blue, MD

## 2020-10-20 DIAGNOSIS — Z20828 Contact with and (suspected) exposure to other viral communicable diseases: Secondary | ICD-10-CM | POA: Diagnosis not present

## 2020-10-21 DIAGNOSIS — Z20822 Contact with and (suspected) exposure to covid-19: Secondary | ICD-10-CM | POA: Diagnosis not present

## 2020-10-21 DIAGNOSIS — Z03818 Encounter for observation for suspected exposure to other biological agents ruled out: Secondary | ICD-10-CM | POA: Diagnosis not present

## 2020-11-12 ENCOUNTER — Telehealth: Payer: Self-pay | Admitting: Internal Medicine

## 2020-11-12 DIAGNOSIS — R1030 Lower abdominal pain, unspecified: Secondary | ICD-10-CM

## 2020-11-12 DIAGNOSIS — Z538 Procedure and treatment not carried out for other reasons: Secondary | ICD-10-CM

## 2020-11-12 NOTE — Telephone Encounter (Signed)
Copied from CRM [id number]. Topic: General - Other >> Nov 12, 2020  9:39 AM Karen Burch wrote: Reason for CRM: Patient called to ask the nurse to call her regarding her appt. On Monday.  Patient stated she has just gotten her period and is scheduled for a Pap on Monday and would like to know if she should cancel that appt.  Please advise and call patient at [phone]   Spoke with Pt and she is rescheduled for June 23rd. Patient asked if Dr. Laural Benes could give a referral for her to go see an OBGYN. Please advise.

## 2020-11-12 NOTE — Telephone Encounter (Signed)
Will forward to provider  

## 2020-11-12 NOTE — Telephone Encounter (Signed)
She stated because she wanted to be examined before the June appt with Dr. Laural Benes if she can

## 2020-11-12 NOTE — Telephone Encounter (Signed)
What is the reason pt is wanting to see OBGYN

## 2020-11-12 NOTE — Telephone Encounter (Signed)
Pt is wanting a OBGYN referral placed

## 2020-11-16 ENCOUNTER — Ambulatory Visit: Payer: Federal, State, Local not specified - PPO | Admitting: Internal Medicine

## 2020-12-03 ENCOUNTER — Other Ambulatory Visit: Payer: Self-pay

## 2020-12-03 ENCOUNTER — Emergency Department (HOSPITAL_COMMUNITY)
Admission: EM | Admit: 2020-12-03 | Discharge: 2020-12-04 | Disposition: A | Discharge status: Home / Self Care | Payer: Federal, State, Local not specified - PPO | Attending: Physician Assistant | Admitting: Physician Assistant

## 2020-12-03 ENCOUNTER — Encounter (HOSPITAL_COMMUNITY): Payer: Self-pay | Admitting: Emergency Medicine

## 2020-12-03 DIAGNOSIS — M545 Low back pain, unspecified: Secondary | ICD-10-CM | POA: Insufficient documentation

## 2020-12-03 DIAGNOSIS — Z5321 Procedure and treatment not carried out due to patient leaving prior to being seen by health care provider: Secondary | ICD-10-CM | POA: Insufficient documentation

## 2020-12-03 DIAGNOSIS — R1084 Generalized abdominal pain: Secondary | ICD-10-CM | POA: Insufficient documentation

## 2020-12-03 DIAGNOSIS — R110 Nausea: Secondary | ICD-10-CM | POA: Diagnosis not present

## 2020-12-03 DIAGNOSIS — R1030 Lower abdominal pain, unspecified: Secondary | ICD-10-CM | POA: Diagnosis not present

## 2020-12-03 LAB — CBC WITH DIFFERENTIAL/PLATELET
Abs Immature Granulocytes: 0.01 10*3/uL (ref 0.00–0.07)
Basophils Absolute: 0 10*3/uL (ref 0.0–0.1)
Basophils Relative: 1 %
Eosinophils Absolute: 0.2 10*3/uL (ref 0.0–0.5)
Eosinophils Relative: 3 %
HCT: 41.9 % (ref 36.0–46.0)
Hemoglobin: 13.1 g/dL (ref 12.0–15.0)
Immature Granulocytes: 0 %
Lymphocytes Relative: 38 %
Lymphs Abs: 2.8 10*3/uL (ref 0.7–4.0)
MCH: 26.6 pg (ref 26.0–34.0)
MCHC: 31.3 g/dL (ref 30.0–36.0)
MCV: 85 fL (ref 80.0–100.0)
Monocytes Absolute: 0.7 10*3/uL (ref 0.1–1.0)
Monocytes Relative: 9 %
Neutro Abs: 3.6 10*3/uL (ref 1.7–7.7)
Neutrophils Relative %: 49 %
Platelets: 422 10*3/uL — ABNORMAL HIGH (ref 150–400)
RBC: 4.93 MIL/uL (ref 3.87–5.11)
RDW: 15.2 % (ref 11.5–15.5)
WBC: 7.2 10*3/uL (ref 4.0–10.5)
nRBC: 0 % (ref 0.0–0.2)

## 2020-12-03 LAB — URINALYSIS, ROUTINE W REFLEX MICROSCOPIC
Bilirubin Urine: NEGATIVE
Glucose, UA: NEGATIVE mg/dL
Hgb urine dipstick: NEGATIVE
Ketones, ur: NEGATIVE mg/dL
Leukocytes,Ua: NEGATIVE
Nitrite: NEGATIVE
Protein, ur: NEGATIVE mg/dL
Specific Gravity, Urine: 1.024 (ref 1.005–1.030)
pH: 8 (ref 5.0–8.0)

## 2020-12-03 LAB — COMPREHENSIVE METABOLIC PANEL
ALT: 19 U/L (ref 0–44)
AST: 20 U/L (ref 15–41)
Albumin: 3.6 g/dL (ref 3.5–5.0)
Alkaline Phosphatase: 80 U/L (ref 38–126)
Anion gap: 6 (ref 5–15)
BUN: 15 mg/dL (ref 6–20)
CO2: 27 mmol/L (ref 22–32)
Calcium: 9 mg/dL (ref 8.9–10.3)
Chloride: 105 mmol/L (ref 98–111)
Creatinine, Ser: 0.95 mg/dL (ref 0.44–1.00)
GFR, Estimated: >60 mL/min (ref >60)
Glucose, Bld: 96 mg/dL (ref 70–99)
Potassium: 4 mmol/L (ref 3.5–5.1)
Sodium: 138 mmol/L (ref 135–145)
Total Bilirubin: 0.4 mg/dL (ref 0.3–1.2)
Total Protein: 7.6 g/dL (ref 6.5–8.1)

## 2020-12-03 LAB — LIPASE, BLOOD: Lipase: 39 U/L (ref 11–51)

## 2020-12-03 LAB — I-STAT BETA HCG BLOOD, ED (MC, WL, AP ONLY): I-stat hCG, quantitative: <5 m[IU]/mL (ref <5)

## 2020-12-03 NOTE — ED Triage Notes (Signed)
Pt reports generalized abd pain that has been gradually getting worse since Saturday. Pt reports nausea and pressure in her abdomen started today. No v/d. Pt also reports lower back pain. Denies any urinary complaints. LMP 11/14/20

## 2020-12-03 NOTE — ED Provider Notes (Signed)
Emergency Medicine Provider Triage Evaluation Note  Karen Burch , a 25 y.o. female  was evaluated in triage.  Pt complains of lower abdominal pain for the past 5 days.  Reports associated nausea.  Also having pelvic pain but denies any vaginal bleeding.  She is unsure if she could be pregnant.  Has not tried any home medications, no changes to bowel movements.  Review of Systems  Positive: Abdominal pain, pelvic pain, nausea Negative: Change to bowel movements, vaginal bleeding  Physical Exam  BP (!) 143/90 (BP Location: Left Arm)   Pulse 81   Temp 99.1 F (37.3 C) (Oral)   Resp 16   SpO2 99%  Gen:   Awake, no distress Resp:  Normal effort MSK:   Moves extremities without difficulty Other:  Tenderness to the lower abdomen without rebound or guarding  Medical Decision Making  Medically screening exam initiated at 9:31 PM.  Appropriate orders placed.  Gowri Pro was informed that the remainder of the evaluation will be completed by another provider, this initial triage assessment does not replace that evaluation, and the importance of remaining in the ED until their evaluation is complete.  Will order lab work and hCG   Dietrich Pates, PA-C 12/03/20 2132    Gwyneth Sprout, MD 12/04/20 [phone]

## 2020-12-04 NOTE — ED Notes (Signed)
Called for room x4 

## 2020-12-18 ENCOUNTER — Encounter: Payer: Federal, State, Local not specified - PPO | Admitting: Certified Nurse Midwife

## 2020-12-24 ENCOUNTER — Ambulatory Visit: Payer: Federal, State, Local not specified - PPO | Admitting: Internal Medicine

## 2021-02-02 ENCOUNTER — Other Ambulatory Visit: Payer: Self-pay

## 2021-02-02 ENCOUNTER — Ambulatory Visit
Admission: EM | Admit: 2021-02-02 | Discharge: 2021-02-02 | Disposition: A | Discharge status: Home / Self Care | Payer: Federal, State, Local not specified - PPO | Attending: Emergency Medicine | Admitting: Emergency Medicine

## 2021-02-02 DIAGNOSIS — J039 Acute tonsillitis, unspecified: Secondary | ICD-10-CM | POA: Insufficient documentation

## 2021-02-02 LAB — POCT RAPID STREP A (OFFICE): Rapid Strep A Screen: NEGATIVE

## 2021-02-02 LAB — POCT MONO SCREEN (KUC): Mono, POC: NEGATIVE

## 2021-02-02 MED ORDER — AMOXICILLIN 500 MG PO CAPS: 500.0000 mg | ORAL_CAPSULE | Freq: Two times a day (BID) | ORAL | 0 refills | Status: AC

## 2021-02-02 MED ORDER — IBUPROFEN 800 MG PO TABS: 800.0000 mg | ORAL_TABLET | Freq: Three times a day (TID) | ORAL | 0 refills | Status: DC

## 2021-02-02 NOTE — Discharge Instructions (Addendum)
Strep test negative, monotest negative Begin Tylenol and ibuprofen to help with pain and swelling Amoxicillin twice daily for 10 days to treat tonsillitis May continue over-the-counter medicines for congestion/postnasal drainage Follow-up if not improving or worsening

## 2021-02-02 NOTE — ED Provider Notes (Signed)
[facility]    CSN: [id number] Arrival date & time: 02/02/21  1038      History   Chief Complaint Chief Complaint  Patient presents with   Sore Throat    HPI Karen Burch is a 25 y.o. female presenting today for evaluation of sore throat.  Reports 1 to 2 days of sore throat and today noticed more swollen tonsils with associated white patches.  Slight postnasal drainage, denies associated cough.  Denies fever.  Denies nausea vomiting or diarrhea.  Reports mono exposure 2 months ago.  HPI  History reviewed. No pertinent past medical history.  There are no problems to display for this patient.   History reviewed. No pertinent surgical history.  OB History   No obstetric history on file.      Home Medications    Prior to Admission medications   Medication Sig Start Date End Date Taking? Authorizing Provider  amoxicillin (AMOXIL) 500 MG capsule Take 1 capsule (500 mg total) by mouth 2 (two) times daily for 10 days. 02/02/21 02/12/21 Yes Tennis Mckinnon C, PA-C  ibuprofen (ADVIL) 800 MG tablet Take 1 tablet (800 mg total) by mouth 3 (three) times daily. 02/02/21  Yes Bahar Shelden C, PA-C  acetaminophen (TYLENOL) 325 MG tablet Take 650 mg by mouth every 6 (six) hours as needed. Patient not taking: Reported on 10/02/2020    [provider]  ondansetron (ZOFRAN ODT) 4 MG disintegrating tablet Take 1 tablet (4 mg total) by mouth every 6 (six) hours as needed for nausea or vomiting. Patient not taking: Reported on 10/02/2020 08/17/20   Sammuel Cooper, PA-C    Family History Family History  Problem Relation Age of Onset   Asthma Father    Hypertension Other    Depression Mother        anxiety   Cancer Maternal Grandfather    Heart attack Paternal Grandmother    Colon cancer Neg Hx    Esophageal cancer Neg Hx     Social History Social History   Tobacco Use   Smoking status: Never   Smokeless tobacco: Never  Vaping Use   Vaping Use: Never used   Substance Use Topics   Alcohol use: Yes    Alcohol/week: 6.0 standard drinks    Types: 6 Glasses of wine per week    Comment: occ   Drug use: Yes    Types: Marijuana    Comment: occ     Allergies   Patient has no known allergies.   Review of Systems Review of Systems  Constitutional:  Negative for activity change, appetite change, chills, fatigue and fever.  HENT:  Positive for sore throat. Negative for congestion, ear pain, rhinorrhea, sinus pressure and trouble swallowing.   Eyes:  Negative for discharge and redness.  Respiratory:  Negative for cough, chest tightness and shortness of breath.   Cardiovascular:  Negative for chest pain.  Gastrointestinal:  Negative for abdominal pain, diarrhea, nausea and vomiting.  Musculoskeletal:  Negative for myalgias.  Skin:  Negative for rash.  Neurological:  Negative for dizziness, light-headedness and headaches.    Physical Exam Triage Vital Signs ED Triage Vitals  Enc Vitals Group     BP      Pulse      Resp      Temp      Temp src      SpO2      Weight      Height      Head Circumference  Peak Flow      Pain Score      Pain Loc      Pain Edu?      Excl. in GC?    No data found.  Updated Vital Signs BP 124/86 (BP Location: Right Arm)   Pulse 79   Temp 99.4 F (37.4 C) (Oral)   Resp 12   Ht 5\' 3"  (1.6 m)   Wt 180 lb (81.6 kg)   SpO2 97%   BMI 31.89 kg/m   Visual Acuity Right Eye Distance:   Left Eye Distance:   Bilateral Distance:    Right Eye Near:   Left Eye Near:    Bilateral Near:     Physical Exam Vitals and nursing note reviewed.  Constitutional:      Appearance: She is well-developed.     Comments: No acute distress  HENT:     Head: Normocephalic and atraumatic.     Ears:     Comments: Bilateral ears without tenderness to palpation of external auricle, tragus and mastoid, EAC's without erythema or swelling, TM's with good bony landmarks and cone of light. Non erythematous.       Nose: Nose normal.     Mouth/Throat:     Comments: Oral mucosa pink and moist, bilateral tonsils enlarged and mildly erythematous, right greater than left with white exudate bilaterally, no soft palate swelling. Posterior pharynx patent and nonerythematous, no uvula deviation or swelling. Normal phonation.   Eyes:     Conjunctiva/sclera: Conjunctivae normal.  Cardiovascular:     Rate and Rhythm: Normal rate.  Pulmonary:     Effort: Pulmonary effort is normal. No respiratory distress.     Comments: Breathing comfortably at rest, CTABL, no wheezing, rales or other adventitious sounds auscultated  Abdominal:     General: There is no distension.  Musculoskeletal:        General: Normal range of motion.     Cervical back: Neck supple.  Skin:    General: Skin is warm and dry.  Neurological:     Mental Status: She is alert and oriented to person, place, and time.     UC Treatments / Results  Labs (all labs ordered are listed, but only abnormal results are displayed) Labs Reviewed  CULTURE, GROUP A STREP [facility])  POCT RAPID STREP A (OFFICE)  POCT MONO SCREEN [facility])    EKG   Radiology No results found.  Procedures Procedures (including critical care time)  Medications Ordered in UC Medications - No data to display  Initial Impression / Assessment and Plan / UC Course  I have reviewed the triage vital signs and the nursing notes.  Pertinent labs & imaging results that were available during my care of the patient were reviewed by me and considered in my medical decision making (see chart for details).     Strep negative, mono negative, treating for tonsillitis given appearance of tonsils and recommending initiating starting on amoxicillin x10 days, continue symptomatic and supportive care as well with close monitoring.  Discussed strict return precautions. Patient verbalized understanding and is agreeable with plan.  Final Clinical Impressions(s) / UC Diagnoses   Final  diagnoses:  Acute tonsillitis, unspecified etiology     Discharge Instructions      Strep test negative, monotest negative Begin Tylenol and ibuprofen to help with pain and swelling Amoxicillin twice daily for 10 days to treat tonsillitis May continue over-the-counter medicines for congestion/postnasal drainage Follow-up if not improving or worsening  ED Prescriptions     Medication Sig Dispense Auth. Provider   ibuprofen (ADVIL) 800 MG tablet Take 1 tablet (800 mg total) by mouth 3 (three) times daily. 21 tablet Chaden Doom C, PA-C   amoxicillin (AMOXIL) 500 MG capsule Take 1 capsule (500 mg total) by mouth 2 (two) times daily for 10 days. 20 capsule Tamotsu Wiederholt, Odon C, PA-C      PDMP not reviewed this encounter.   Lew Dawes, [place] 02/02/21 1218

## 2021-02-02 NOTE — ED Triage Notes (Signed)
Pt seen in UC w/ c/o sore throat x1d with [place]way patches.

## 2021-02-05 LAB — CULTURE, GROUP A STREP (THRC)

## 2021-02-19 DIAGNOSIS — U071 COVID-19: Secondary | ICD-10-CM | POA: Diagnosis not present

## 2021-05-22 ENCOUNTER — Ambulatory Visit (HOSPITAL_COMMUNITY): Admit: 2021-05-22 | Discharge status: Against Medical Advice | Payer: Federal, State, Local not specified - PPO

## 2021-05-22 DIAGNOSIS — R109 Unspecified abdominal pain: Secondary | ICD-10-CM | POA: Diagnosis not present

## 2021-05-24 DIAGNOSIS — R109 Unspecified abdominal pain: Secondary | ICD-10-CM | POA: Diagnosis not present

## 2021-06-04 ENCOUNTER — Other Ambulatory Visit (HOSPITAL_COMMUNITY)
Admission: RE | Admit: 2021-06-04 | Discharge: 2021-06-04 | Disposition: A | Discharge status: Home / Self Care | Payer: Federal, State, Local not specified - PPO | Source: Ambulatory Visit | Attending: Internal Medicine | Admitting: Internal Medicine

## 2021-06-04 ENCOUNTER — Encounter: Payer: Self-pay | Admitting: Internal Medicine

## 2021-06-04 ENCOUNTER — Other Ambulatory Visit: Payer: Self-pay

## 2021-06-04 ENCOUNTER — Ambulatory Visit: Payer: Federal, State, Local not specified - PPO | Attending: Internal Medicine | Admitting: Internal Medicine

## 2021-06-04 VITALS — BP 114/80 | HR 86 | Ht 64.5 in | Wt 191.0 lb

## 2021-06-04 DIAGNOSIS — Z124 Encounter for screening for malignant neoplasm of cervix: Secondary | ICD-10-CM | POA: Diagnosis not present

## 2021-06-04 DIAGNOSIS — Z6832 Body mass index (BMI) 32.0-32.9, adult: Secondary | ICD-10-CM

## 2021-06-04 DIAGNOSIS — Z0000 Encounter for general adult medical examination without abnormal findings: Secondary | ICD-10-CM

## 2021-06-04 DIAGNOSIS — Z3009 Encounter for other general counseling and advice on contraception: Secondary | ICD-10-CM | POA: Diagnosis not present

## 2021-06-04 DIAGNOSIS — Z114 Encounter for screening for human immunodeficiency virus [HIV]: Secondary | ICD-10-CM

## 2021-06-04 DIAGNOSIS — Z23 Encounter for immunization: Secondary | ICD-10-CM

## 2021-06-04 DIAGNOSIS — E669 Obesity, unspecified: Secondary | ICD-10-CM

## 2021-06-04 DIAGNOSIS — Z1159 Encounter for screening for other viral diseases: Secondary | ICD-10-CM

## 2021-06-04 DIAGNOSIS — Z0001 Encounter for general adult medical examination with abnormal findings: Secondary | ICD-10-CM

## 2021-06-04 DIAGNOSIS — Z113 Encounter for screening for infections with a predominantly sexual mode of transmission: Secondary | ICD-10-CM

## 2021-06-04 NOTE — Progress Notes (Signed)
PAP and Physical

## 2021-06-04 NOTE — Patient Instructions (Addendum)
Please check your records when you return home to see whether you have had the tetanus vaccine numerous Tdap and HPV vaccine series.  The HPV vaccine helps to protect you from getting the HPV virus which can cause cervical cancer.  If you have not had the series, please let me know and we can have you come back as a nurse only visit to start.  Healthy Eating Following a healthy eating pattern may help you to achieve and maintain a healthy body weight, reduce the risk of chronic disease, and live a long and productive life. It is important to follow a healthy eating pattern at an appropriate calorie level for your body. Your nutritional needs should be met primarily through food by choosing a variety of nutrient-rich foods. What are tips for following this plan? Reading food labels Read labels and choose the following: Reduced or low sodium. Juices with 100% fruit juice. Foods with low saturated fats and high polyunsaturated and monounsaturated fats. Foods with whole grains, such as whole wheat, cracked wheat, brown rice, and wild rice. Whole grains that are fortified with folic acid. This is recommended for women who are pregnant or who want to become pregnant. Read labels and avoid the following: Foods with a lot of added sugars. These include foods that contain brown sugar, corn sweetener, corn syrup, dextrose, fructose, glucose, high-fructose corn syrup, honey, invert sugar, lactose, malt syrup, maltose, molasses, raw sugar, sucrose, trehalose, or turbinado sugar. Do not eat more than the following amounts of added sugar per day: 6 teaspoons (25 g) for women. 9 teaspoons (38 g) for men. Foods that contain processed or refined starches and grains. Refined grain products, such as white flour, degermed cornmeal, white bread, and white rice. Shopping Choose nutrient-rich snacks, such as vegetables, whole fruits, and nuts. Avoid high-calorie and high-sugar snacks, such as potato chips, fruit  snacks, and candy. Use oil-based dressings and spreads on foods instead of solid fats such as butter, stick margarine, or cream cheese. Limit pre-made sauces, mixes, and "instant" products such as flavored rice, instant noodles, and ready-made pasta. Try more plant-protein sources, such as tofu, tempeh, black beans, edamame, lentils, nuts, and seeds. Explore eating plans such as the Mediterranean diet or vegetarian diet. Cooking Use oil to saut or stir-fry foods instead of solid fats such as butter, stick margarine, or lard. Try baking, boiling, grilling, or broiling instead of frying. Remove the fatty part of meats before cooking. Steam vegetables in water or broth. Meal planning  At meals, imagine dividing your plate into fourths: One-half of your plate is fruits and vegetables. One-fourth of your plate is whole grains. One-fourth of your plate is protein, especially lean meats, poultry, eggs, tofu, beans, or nuts. Include low-fat dairy as part of your daily diet. Lifestyle Choose healthy options in all settings, including home, work, school, restaurants, or stores. Prepare your food safely: Wash your hands after handling raw meats. Keep food preparation surfaces clean by regularly washing with hot, soapy water. Keep raw meats separate from ready-to-eat foods, such as fruits and vegetables. Cook seafood, meat, poultry, and eggs to the recommended internal temperature. Store foods at safe temperatures. In general: Keep cold foods at 30F (4.4C) or below. Keep hot foods at 130F (60C) or above. Keep your freezer at [facility] (-17.8C) or below. Foods are no longer safe to eat when they have been between the temperatures of 40-130F (4.4-60C) for more than 2 hours. What foods should I eat? Fruits Aim to eat 2 cup-equivalents  of fresh, canned (in natural juice), or frozen fruits each day. Examples of 1 cup-equivalent of fruit include 1 small apple, 8 large strawberries, 1 cup canned  fruit,  cup dried fruit, or 1 cup 100% juice. Vegetables Aim to eat 2-3 cup-equivalents of fresh and frozen vegetables each day, including different varieties and colors. Examples of 1 cup-equivalent of vegetables include 2 medium carrots, 2 cups raw, leafy greens, 1 cup chopped vegetable (raw or cooked), or 1 medium baked potato. Grains Aim to eat 6 ounce-equivalents of whole grains each day. Examples of 1 ounce-equivalent of grains include 1 slice of bread, 1 cup ready-to-eat cereal, 3 cups popcorn, or  cup cooked rice, pasta, or cereal. Meats and other proteins Aim to eat 5-6 ounce-equivalents of protein each day. Examples of 1 ounce-equivalent of protein include 1 egg, 1/2 cup nuts or seeds, or 1 tablespoon (16 g) peanut butter. A cut of meat or fish that is the size of a deck of cards is about 3-4 ounce-equivalents. Of the protein you eat each week, try to have at least 8 ounces come from seafood. This includes salmon, trout, herring, and anchovies. Dairy Aim to eat 3 cup-equivalents of fat-free or low-fat dairy each day. Examples of 1 cup-equivalent of dairy include 1 cup (240 mL) milk, 8 ounces (250 g) yogurt, 1 ounces (44 g) natural cheese, or 1 cup (240 mL) fortified soy milk. Fats and oils Aim for about 5 teaspoons (21 g) per day. Choose monounsaturated fats, such as canola and olive oils, avocados, peanut butter, and most nuts, or polyunsaturated fats, such as sunflower, corn, and soybean oils, walnuts, pine nuts, sesame seeds, sunflower seeds, and flaxseed. Beverages Aim for six 8-oz glasses of water per day. Limit coffee to three to five 8-oz cups per day. Limit caffeinated beverages that have added calories, such as soda and energy drinks. Limit alcohol intake to no more than 1 drink a day for nonpregnant women and 2 drinks a day for men. One drink equals 12 oz of beer (355 mL), 5 oz of wine (148 mL), or 1 oz of hard liquor (44 mL). Seasoning and other foods Avoid adding excess  amounts of salt to your foods. Try flavoring foods with herbs and spices instead of salt. Avoid adding sugar to foods. Try using oil-based dressings, sauces, and spreads instead of solid fats. This information is based on general [place] nutrition guidelines. For more information, visit [employer].es. Exact amounts may vary based on your nutrition needs. Summary A healthy eating plan may help you to maintain a healthy weight, reduce the risk of chronic diseases, and stay active throughout your life. Plan your meals. Make sure you eat the right portions of a variety of nutrient-rich foods. Try baking, boiling, grilling, or broiling instead of frying. Choose healthy options in all settings, including home, work, school, restaurants, or stores. This information is not intended to replace advice given to you by your health care provider. Make sure you discuss any questions you have with your health care provider. Document Revised: 02/16/2021 Document Reviewed: 02/16/2021 Elsevier Patient Education  [place].

## 2021-06-04 NOTE — Progress Notes (Signed)
Patient ID: Karen Burch, female    DOB: Aug 02, 1995  MRN: [id number]  CC: annual exam   Subjective: Karen Burch is a 25 y.o. female who presents for physical exam Her concerns today include:   GYN History:  Pt is G0P0 Any hx of abn paps?:no Menses regular or irregular?: regular How long does menses last? 5 days Menstrual flow light or heavy?: moderate Method of birth control?:  no but would like to be placed on BC. In past she on BCP. No hx of blood clots or liver issues. Any vaginal dischg at this time?: watery discgh. Recently dx with Chlamydia on 05/25/2021 and treated.  Partner tested negative Dysuria?: no Any hx of STI?: see above Sexually active with how many partners: one female partner lately.  Previously had multiple partners, not using condoms consistently Desires STI screen: wants to be recheck for STIs including HIV/hep C/syphilis Last MMG: NA Family hx of uterine, cervical or breast cancer?:  no  HM:  due for flu vaccine.  Thinks she had Tdapt and HPV series.  Will ask her mom to check her immunization records.    No current outpatient medications on file prior to visit.   No current facility-administered medications on file prior to visit.    No Known Allergies  Social History   Socioeconomic History   Marital status: Single    Spouse name: Not on file   Number of children: 0   Years of education: Not on file   Highest education level: Bachelor's degree (e.g., BA, AB, BS)  Occupational History   Not on file  Tobacco Use   Smoking status: Never   Smokeless tobacco: Never  Vaping Use   Vaping Use: Never used  Substance and Sexual Activity   Alcohol use: Yes    Alcohol/week: 6.0 standard drinks    Types: 6 Glasses of wine per week    Comment: occ   Drug use: Not Currently   Sexual activity: Yes    Birth control/protection: None  Other Topics Concern   Not on file  Social History Narrative   Not on file   Social Determinants of Health    Financial Resource Strain: Not on file  Food Insecurity: Not on file  Transportation Needs: Not on file  Physical Activity: Not on file  Stress: Not on file  Social Connections: Not on file  Intimate Partner Violence: Not on file    Family History  Problem Relation Age of Onset   Asthma Father    Hypertension Other    Depression Mother        anxiety   Cancer Maternal Grandfather    Heart attack Paternal Grandmother    Colon cancer Neg Hx    Esophageal cancer Neg Hx     History reviewed. No pertinent surgical history.  ROS: Review of Systems  Constitutional:        Noted to gain 11 lbs since 02/2021. Use to eat out a lot but not in past 6 mths.  Portion sizes not too large.  Drinks juices and wine (1 glass/per night but not every day), eats fruits and veggies daily.  Use to gym but not any more.   HENT:  Negative for dental problem, hearing loss and sore throat.        She has not seen a dentist in years.  Eyes:        Wears prescription glasses for distance.  Last eye exam was in May of this year.  Respiratory:  Negative for cough and shortness of breath.   Cardiovascular:  Negative for chest pain.  Gastrointestinal:        No problems swallowing.  She is moving her bowels okay.    PHYSICAL EXAM: BP 114/80   Pulse 86   Ht 5' 4.5" (1.638 m)   Wt 191 lb (86.6 kg)   LMP 05/23/2021   SpO2 98%   BMI 32.28 kg/m   Wt Readings from Last 3 Encounters:  06/04/21 191 lb (86.6 kg)  02/02/21 180 lb (81.6 kg)  12/03/20 180 lb (81.6 kg)    Physical Exam  General appearance - alert, well appearing, young African-American female in NAD and in no distress Mental status - normal mood, behavior, speech, dress, motor activity, and thought processes Eyes - pupils equal and reactive, extraocular eye movements intact Ears - bilateral TM's and external ear canals normal Nose - normal and patent, no erythema, discharge or polyps Mouth - mucous membranes moist, pharynx normal  without lesions Neck - supple, no significant adenopathy Lymphatics - no palpable lymphadenopathy, no hepatosplenomegaly Chest - clear to auscultation, no wheezes, rales or rhonchi, symmetric air entry Heart - normal rate, regular rhythm, normal S1, S2, no murmurs, rubs, clicks or gallops Abdomen - soft, nontender, nondistended, no masses or organomegaly Breasts - [employer] Student Ms. McNairy present for breast and pelvic exam: breasts appear normal, no suspicious masses, no skin or nipple changes or axillary nodes Pelvic - normal external genitalia, vulva, vagina, cervix, uterus and adnexa Neurological - cranial nerves II through XII intact, motor and sensory grossly normal bilaterally Musculoskeletal - no joint tenderness, deformity or swelling Extremities - peripheral pulses normal, no pedal edema, no clubbing or cyanosis   Depression screen [facility] 2/9 06/04/2021 10/02/2020  Decreased Interest 0 0  Down, Depressed, Hopeless 0 0  PHQ - 2 Score 0 0  Altered sleeping 0 -  Tired, decreased energy 0 -  Change in appetite 1 -  Feeling bad or failure about yourself  1 -  Trouble concentrating 0 -  Moving slowly or fidgety/restless 0 -  Suicidal thoughts 0 -  PHQ-9 Score 2 -   GAD 7 : Generalized Anxiety Score 06/04/2021 10/02/2020  Nervous, Anxious, on Edge 2 0  Control/stop worrying 1 0  Worry too much - different things 1 0  Trouble relaxing 0 0  Restless 0 0  Easily annoyed or irritable 0 0  Afraid - awful might happen 0 0  Total GAD 7 Score 4 0     CMP Latest Ref Rng & Units 12/03/2020 08/09/2020 08/03/2020  Glucose 70 - 99 mg/dL 96 93 93  BUN 6 - 20 mg/dL 15 12 12   Creatinine 0.44 - 1.00 mg/dL 1.61 0.96  Sodium 135 - 145 mmol/L 138 139 138  Potassium 3.5 - 5.1 mmol/L 4.0 4.4 3.9  Chloride 98 - 111 mmol/L 105 105 102  CO2 22 - 32 mmol/L 27 25 25   Calcium 8.9 - 10.3 mg/dL 9.0 9.3 9.2  Total Protein 6.5 - 8.1 g/dL 7.6 7.9 0.45)  Total Bilirubin 0.3 - 1.2 mg/dL 0.4 0.4 0.6  Alkaline  Phos 38 - 126 U/L 80 74 85  AST 15 - 41 U/L 20 18 19   ALT 0 - 44 U/L 19 15 16    Lipid Panel  No results found for: CHOL, TRIG, HDL, CHOLHDL, VLDL, LDLCALC, LDLDIRECT  CBC    Component Value Date/Time   WBC 7.2 12/03/2020 2137   RBC 4.93 12/03/2020  2137   HGB 13.1 12/03/2020 2137   HGB 13.9 07/14/2020 1734   HCT 41.9 12/03/2020 2137   HCT 42.5 07/14/2020 1734   PLT 422 (H) 12/03/2020 2137   PLT 477 (H) 07/14/2020 1734   MCV 85.0 12/03/2020 2137   MCV 79 07/14/2020 1734   MCH 26.6 12/03/2020 2137   MCHC 31.3 12/03/2020 2137   RDW 15.2 12/03/2020 2137   RDW 13.9 07/14/2020 1734   LYMPHSABS 2.8 12/03/2020 2137   LYMPHSABS 1.5 07/14/2020 1734   MONOABS 0.7 12/03/2020 2137   EOSABS 0.2 12/03/2020 2137   EOSABS 0.0 07/14/2020 1734   BASOSABS 0.0 12/03/2020 2137   BASOSABS 0.0 07/14/2020 1734    ASSESSMENT AND PLAN: 1. Annual physical exam Discussed and encourage healthy eating habits printed and nation given. Discussed and encouraged moderate intensity exercise with goal of at least 150 minutes total per week.  2. Pap smear for cervical cancer screening - Cervicovaginal ancillary only - Cytology - PAP  3. Obesity (BMI 30.0-34.9) See #1 above. - Hemoglobin A1c  4. Need for immunization against influenza Flu shot given today.  5. Screening for HIV (human immunodeficiency virus) - HIV antibody (with reflex)  6. Need for hepatitis C screening test - Hepatitis C Antibody  7. General counseling and advice on female contraception Discussed birth control methods including birth control pills and long-acting contraceptives like Implanon, IUD and Depo.  Went over risks and benefits of each method.  Patient would like to have Implanon.  Referred to gynecology for this. - Ambulatory referral to Gynecology  8. Screen for sexually transmitted diseases Discussed and encouraged safe sex practices.  Encouraged her to use barrier protection if she is not in a monogamous  relationship. - RPR   Patient was given the opportunity to ask questions.  Patient verbalized understanding of the plan and was able to repeat key elements of the plan.   Orders Placed This Encounter  Procedures   Flu Vaccine QUAD 6+ mos PF IM (Fluarix Quad PF)   Flu Vaccine QUAD 17mo+IM (Fluarix, Fluzone & Alfiuria Quad PF)   RPR   Hepatitis C Antibody   HIV antibody (with reflex)   Hemoglobin A1c   Ambulatory referral to Gynecology      Requested Prescriptions    No prescriptions requested or ordered in this encounter    Return if symptoms worsen or fail to improve.  Jonah Blue, MD, FACP

## 2021-06-05 LAB — HIV ANTIBODY (ROUTINE TESTING W REFLEX): HIV Screen 4th Generation wRfx: NONREACTIVE

## 2021-06-05 LAB — HEMOGLOBIN A1C
Est. average glucose Bld gHb Est-mCnc: 108 mg/dL
Hgb A1c MFr Bld: 5.4 % (ref 4.8–5.6)

## 2021-06-05 LAB — RPR: RPR Ser Ql: NONREACTIVE

## 2021-06-05 LAB — HEPATITIS C ANTIBODY: Hep C Virus Ab: 0.2 s/co ratio (ref 0.0–0.9)

## 2021-06-07 LAB — CERVICOVAGINAL ANCILLARY ONLY
Bacterial Vaginitis (gardnerella): NEGATIVE
Candida Glabrata: NEGATIVE
Candida Vaginitis: NEGATIVE
Chlamydia: NEGATIVE
Comment: NEGATIVE
Comment: NEGATIVE
Comment: NEGATIVE
Comment: NEGATIVE
Comment: NEGATIVE
Comment: NORMAL
Neisseria Gonorrhea: NEGATIVE
Trichomonas: NEGATIVE

## 2021-06-08 LAB — CYTOLOGY - PAP
Adequacy: ABSENT
Comment: NEGATIVE
Diagnosis: UNDETERMINED — AB
High risk HPV: NEGATIVE

## 2021-06-18 ENCOUNTER — Other Ambulatory Visit: Payer: Self-pay

## 2021-06-18 ENCOUNTER — Ambulatory Visit (INDEPENDENT_AMBULATORY_CARE_PROVIDER_SITE_OTHER): Payer: Federal, State, Local not specified - PPO | Admitting: Obstetrics and Gynecology

## 2021-06-18 ENCOUNTER — Encounter: Payer: Self-pay | Admitting: Obstetrics and Gynecology

## 2021-06-18 VITALS — BP 107/71 | HR 74 | Temp 98.0°F | Ht 64.0 in | Wt 192.4 lb

## 2021-06-18 DIAGNOSIS — Z3009 Encounter for other general counseling and advice on contraception: Secondary | ICD-10-CM

## 2021-06-18 NOTE — Progress Notes (Signed)
°  GYNECOLOGY PROGRESS NOTE  History:  [name] is a 25 y.o. G0P0000 presents to [facility] office today for problem gyn visit. She reports she desires to have a Nexplanon.  She denies h/a, dizziness, shortness of breath, n/v, or fever/chills.    The following portions of the patient's history were reviewed and updated as appropriate: allergies, current medications, past family history, past medical history, past social history, past surgical history and problem list. Last pap smear on 06/04/2021 was normal, negative HRHPV.  Review of Systems:  Pertinent items are noted in HPI.   Objective:  Physical Exam Blood pressure 107/71, pulse 74, temperature 98 F (36.7 C), temperature source Oral, height 5\' 4"  (1.626 m), weight 192 lb 6.4 oz (87.3 kg), last menstrual period 05/23/2021. VS reviewed, nursing note reviewed,  Constitutional: well developed, well nourished, no distress HEENT: normocephalic CV: normal rate Pulm/chest wall: normal effort Breast Exam: deferred Abdomen: soft Neuro: alert and oriented x 3 Skin: warm, dry Psych: affect normal Pelvic exam: deferred  Assessment & Plan:  Birth control counseling  - Advised that unprotected sex within in the window of ovulation increases her chances of pregnancy - Advised that she would need to abstain from unprotected sex for 2 weeks  - Schedule Nexplanon insertion for 2 wks. - Patient verbalized an understanding of the plan of care and agrees.   There was a total of 10 minutes face-to-face time discussing Nexplanon and coordinating care.There was 5 minutes of chart review time spent prior to this encounter. Total time spent = 15 minutes.  05/25/2021, CNM 9:46 AM

## 2021-07-01 ENCOUNTER — Ambulatory Visit: Payer: Federal, State, Local not specified - PPO | Admitting: Obstetrics and Gynecology

## 2021-07-15 ENCOUNTER — Encounter: Payer: Self-pay | Admitting: Obstetrics and Gynecology

## 2021-07-15 ENCOUNTER — Other Ambulatory Visit: Payer: Self-pay

## 2021-07-15 ENCOUNTER — Ambulatory Visit (INDEPENDENT_AMBULATORY_CARE_PROVIDER_SITE_OTHER): Payer: Federal, State, Local not specified - PPO | Admitting: Obstetrics and Gynecology

## 2021-07-15 VITALS — BP 129/79 | HR 73 | Temp 98.0°F | Ht 64.0 in | Wt 196.0 lb

## 2021-07-15 DIAGNOSIS — Z30011 Encounter for initial prescription of contraceptive pills: Secondary | ICD-10-CM | POA: Diagnosis not present

## 2021-07-15 DIAGNOSIS — Z3009 Encounter for other general counseling and advice on contraception: Secondary | ICD-10-CM | POA: Diagnosis not present

## 2021-07-17 ENCOUNTER — Encounter: Payer: Self-pay | Admitting: Obstetrics and Gynecology

## 2021-07-17 MED ORDER — SLYND 4 MG PO TABS: 1.0000 | ORAL_TABLET | Freq: Every day | ORAL | 12 refills | Status: AC

## 2021-07-17 NOTE — Progress Notes (Signed)
°  GYNECOLOGY PROGRESS NOTE  History:  [name] is a 26 y.o. G0P0000 presents to [facility] office today for contraception visit. She reports she wants to restart on OCP. She last took them 5-6 years ago. LMP 06/09/2021. Last SI 07/03/2021. She denies h/a, dizziness, shortness of breath, n/v, or fever/chills.    The following portions of the patient's history were reviewed and updated as appropriate: allergies, current medications, past family history, past medical history, past social history, past surgical history and problem list. Last pap smear on 06/04/2021 was abnormal, ASCUS with negative HRHPV.  Review of Systems:  Pertinent items are noted in HPI.   Objective:  Physical Exam Blood pressure 129/79, pulse 73, temperature 98 F (36.7 C), temperature source Oral, height 5\' 4"  (1.626 m), weight 196 lb (88.9 kg), last menstrual period 06/19/2021. UPT = NEGATIVE VS reviewed, nursing note reviewed,  Constitutional: well developed, well nourished, no distress HEENT: normocephalic CV: normal rate Pulm/chest wall: normal effort Breast Exam: deferred Abdomen: soft Neuro: alert and oriented x 3 Skin: warm, dry Psych: affect normal Pelvic exam: deferred  Assessment & Plan:  1. Birth control counseling  2. Encounter for initial prescription of contraceptive pills  - Rx for Drospirenone (SLYND) 4 MG TABS   06/21/2021, CNM 07/15/2021  4:19 PM

## 2021-07-21 ENCOUNTER — Telehealth: Payer: Self-pay | Admitting: *Deleted

## 2021-07-21 NOTE — Telephone Encounter (Signed)
Received fax from [facility] stating slynd is not covered by insurance. Prior authorization is required. Contraceptive Exception Form completed and faxed. Awaiting response.  Clovis Pu, RN

## 2021-09-14 ENCOUNTER — Other Ambulatory Visit: Payer: Self-pay | Admitting: Medical

## 2021-09-14 DIAGNOSIS — Z3009 Encounter for other general counseling and advice on contraception: Secondary | ICD-10-CM

## 2021-09-14 MED ORDER — NORGESTIMATE-ETH ESTRADIOL 0.25-35 MG-MCG PO TABS: 1.0000 | ORAL_TABLET | Freq: Every day | ORAL | 11 refills | Status: AC

## 2021-09-14 NOTE — Progress Notes (Signed)
Patient called the office due to [facility] no covered by insurance. Discussed options for changing to OCPs. Patient has not missed any pills from the samples given in the office. She has had irregular bleeding on Slynd and is agreeable to switching. She denies any health history that would make her a poor candidate for OCPs like HTN, obesity etc... She has been instructed on how to take OCPs and when to start and when a back -up method is needed. Patient voiced understanding and is aware to call my office with any further issues.  ? ?Kerry Hough, PA-C ?09/14/2021 4:36 PM  ? ?

## 2022-12-18 IMAGING — CT CT ABD-PELV W/ CM
2 of 5 series · 16 of 46 positions shown, 18 images · IV contrast (APPLIED)
Comparison: None.

CLINICAL DATA: Nausea and vomiting.

EXAM:
CT ABDOMEN AND PELVIS WITH CONTRAST
TECHNIQUE: Multidetector CT imaging of the abdomen and pelvis was performed
using the standard protocol following bolus administration of
intravenous contrast.
CONTRAST:  100mL OMNIPAQUE IOHEXOL 300 MG/ML  SOLN

[Series 2: axial st · axial · 0.74mm/px · z∈[-105,+250]mm · 13 of 81 slices shown, 15 images]
[im 5/81  soft-tissue]
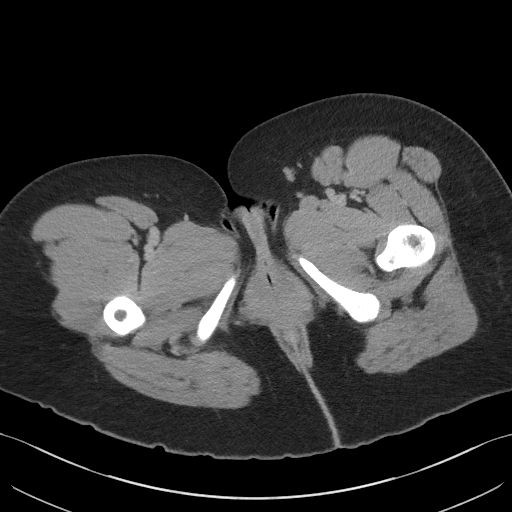
[im 5/81  bone]
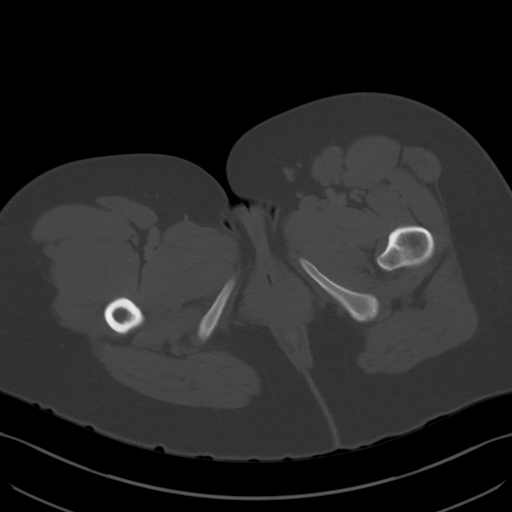
[im 9/81  soft-tissue]
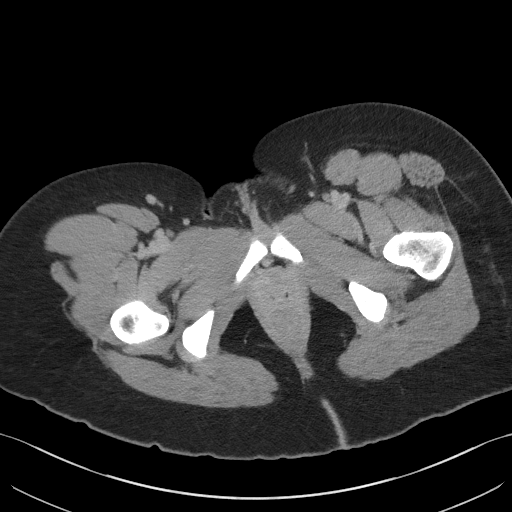
[im 18/81  soft-tissue]
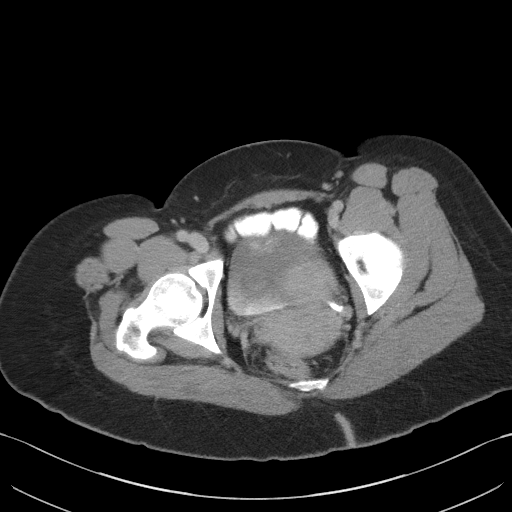
[im 23/81  soft-tissue]
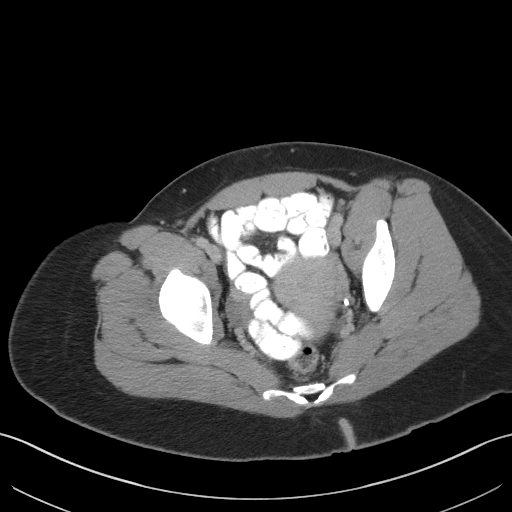
[im 27/81  soft-tissue]
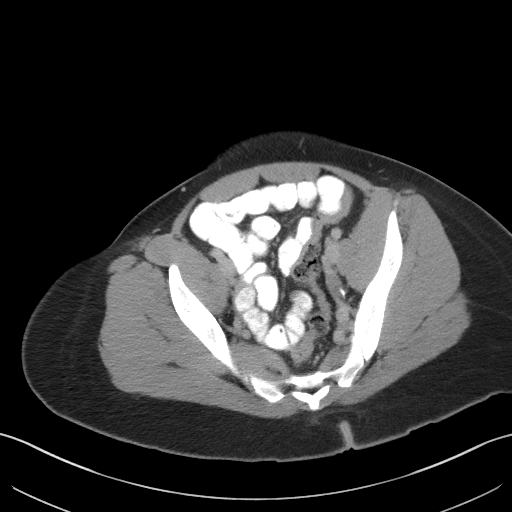
[im 36/81  soft-tissue]
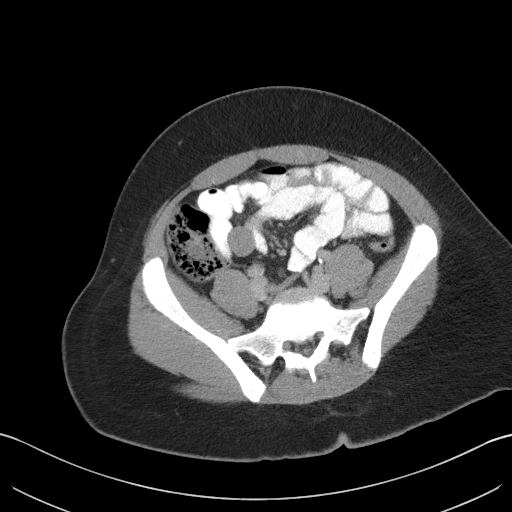
[im 41/81  soft-tissue]
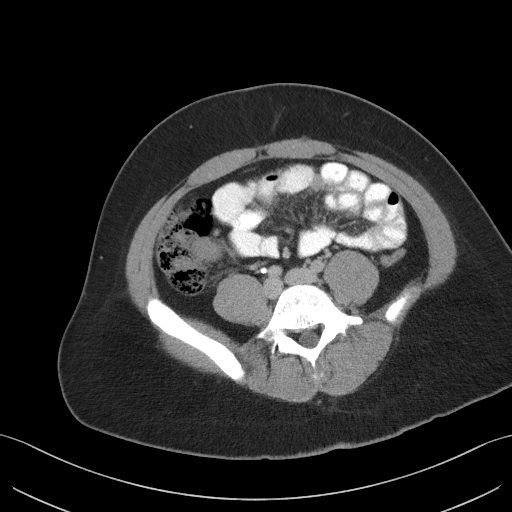
[im 45/81  soft-tissue]
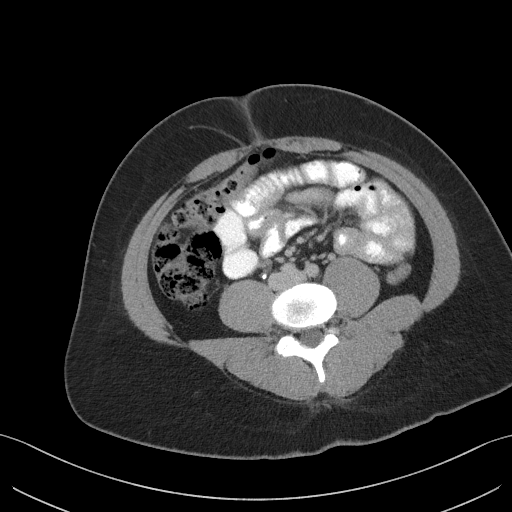
[im 54/81  soft-tissue]
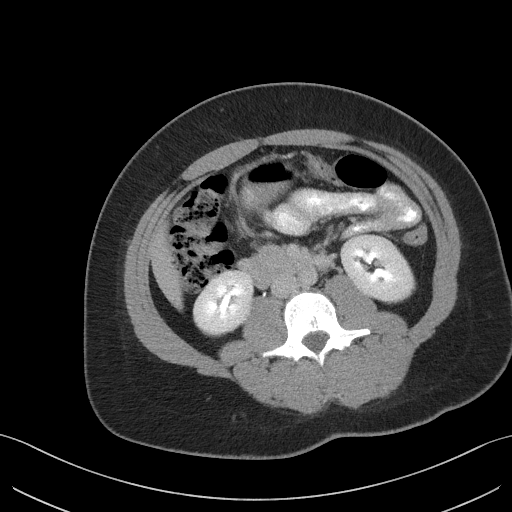
[im 54/81  bone]
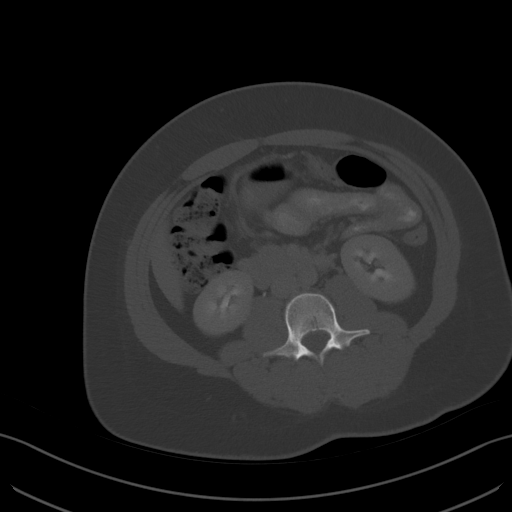
[im 58/81  soft-tissue]
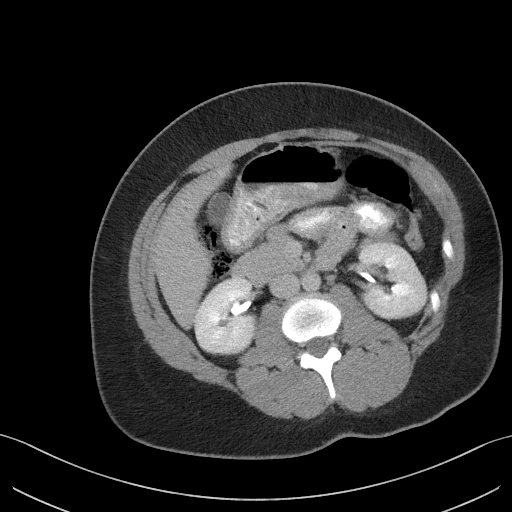
[im 63/81  soft-tissue]
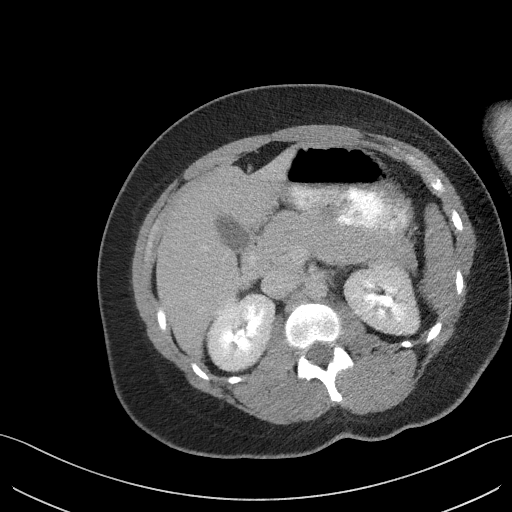
[im 72/81  soft-tissue]
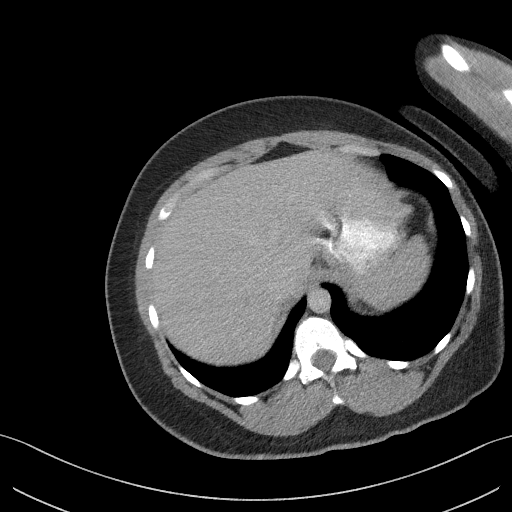
[im 76/81  soft-tissue]
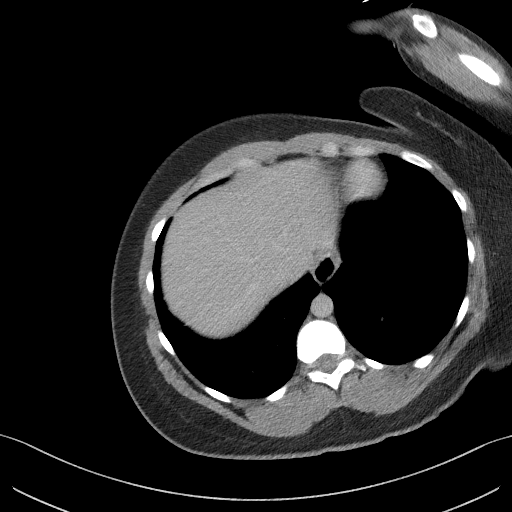

[Series 5: coronal st · coronal · 0.68mm/px · 3 of 101 slices shown]
[im 34/101  soft-tissue]
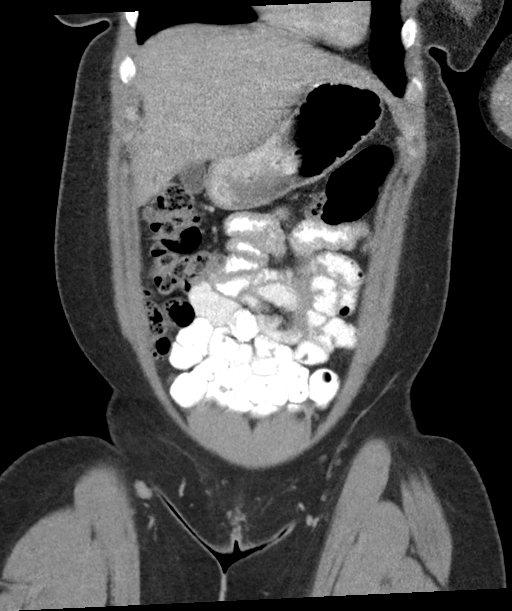
[im 45/101  soft-tissue]
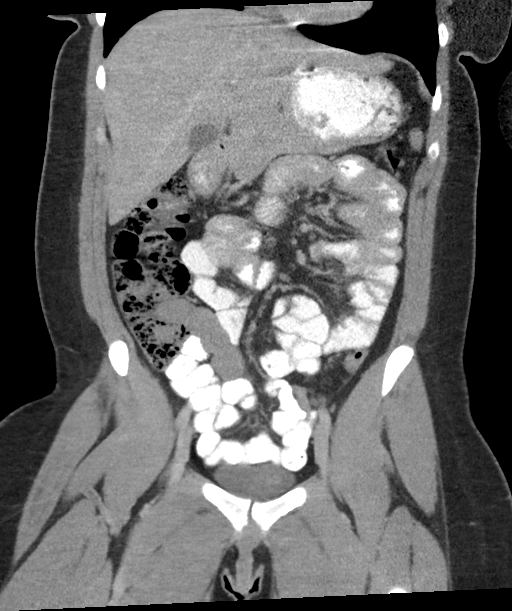
[im 56/101  soft-tissue]
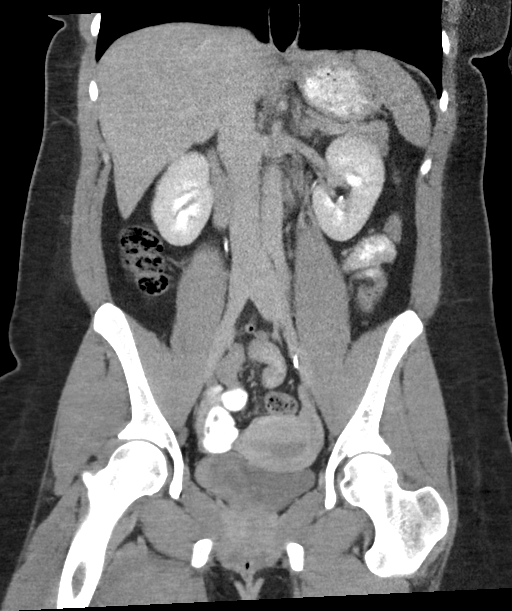

[16 of 46 positions shown; findings below may reference images not displayed]

FINDINGS: Suboptimal bolus timing as patient vomited after contrast injection
resulting in delayed imaging.

Lower chest: Unremarkable.

Hepatobiliary: No suspicious focal abnormality within the liver
parenchyma. There is no evidence for gallstones, gallbladder wall
thickening, or pericholecystic fluid. No intrahepatic or
extrahepatic biliary dilation.

Pancreas: No focal mass lesion. No dilatation of the main duct. No
intraparenchymal cyst. No peripancreatic edema.

Spleen: No splenomegaly. No focal mass lesion.

Adrenals/Urinary Tract: No adrenal nodule or mass. Kidneys
unremarkable. No evidence for hydroureter. The urinary bladder
appears normal for the degree of distention.

Stomach/Bowel: Stomach is unremarkable. No gastric wall thickening.
No evidence of outlet obstruction. Duodenum is normally positioned
as is the ligament of Treitz. No small bowel wall thickening. No
small bowel dilatation. The terminal ileum is normal. The appendix
is normal. No gross colonic mass. No colonic wall thickening.

Vascular/Lymphatic: No abdominal aortic aneurysm. No abdominal
aortic atherosclerotic calcification. There is no gastrohepatic or
hepatoduodenal ligament lymphadenopathy. No retroperitoneal or
mesenteric lymphadenopathy. No pelvic sidewall lymphadenopathy.

Reproductive: Unremarkable.

Other: No intraperitoneal free fluid.

Musculoskeletal: No worrisome lytic or sclerotic osseous
abnormality.
IMPRESSION: No acute findings in the abdomen or pelvis. Specifically, no
findings to explain the patient's history of nausea and vomiting.
